# Patient Record
Sex: Male | Born: 1985 | Race: Black or African American | Hispanic: No | Marital: Single | Smoking: Current every day smoker
Health system: Southern US, Community
[De-identification: ages and names within clinical notes are randomized; demographics above are authoritative.]

## PROBLEM LIST (undated history)

## (undated) DIAGNOSIS — S86012A Strain of left Achilles tendon, initial encounter: Secondary | ICD-10-CM

## (undated) HISTORY — PX: NO PAST SURGERIES: SHX2092

---
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
## 2017-01-26 ENCOUNTER — Encounter (HOSPITAL_COMMUNITY): Payer: Self-pay | Admitting: Emergency Medicine

## 2017-01-26 ENCOUNTER — Emergency Department (HOSPITAL_COMMUNITY): Payer: Self-pay

## 2017-01-26 ENCOUNTER — Emergency Department (HOSPITAL_COMMUNITY)
Admission: EM | Admit: 2017-01-26 | Discharge: 2017-01-26 | Disposition: A | Discharge status: Home / Self Care | Payer: Self-pay | Attending: Emergency Medicine | Admitting: Emergency Medicine

## 2017-01-26 DIAGNOSIS — S86012A Strain of left Achilles tendon, initial encounter: Secondary | ICD-10-CM

## 2017-01-26 DIAGNOSIS — F17200 Nicotine dependence, unspecified, uncomplicated: Secondary | ICD-10-CM | POA: Insufficient documentation

## 2017-01-26 DIAGNOSIS — X501XXA Overexertion from prolonged static or awkward postures, initial encounter: Secondary | ICD-10-CM | POA: Insufficient documentation

## 2017-01-26 DIAGNOSIS — Y929 Unspecified place or not applicable: Secondary | ICD-10-CM | POA: Insufficient documentation

## 2017-01-26 DIAGNOSIS — Y999 Unspecified external cause status: Secondary | ICD-10-CM | POA: Insufficient documentation

## 2017-01-26 DIAGNOSIS — Y9367 Activity, basketball: Secondary | ICD-10-CM | POA: Insufficient documentation

## 2017-01-26 HISTORY — DX: Strain of left Achilles tendon, initial encounter: S86.012A

## 2017-01-26 MED ORDER — IBUPROFEN 800 MG PO TABS
800.0000 mg | ORAL_TABLET | Freq: Once | ORAL | Status: AC
  Administered 2017-01-26: 800 mg via ORAL
  Filled 2017-01-26: qty 1

## 2017-01-26 NOTE — ED Triage Notes (Signed)
Pt comes in with left heel/ankle pain after playing basketball yesterday and another player came down on it.  Able to walk with some pain and weakness.  Denies taking anything at home for the pain. A&O x4.  No other complaints at this time.

## 2017-01-26 NOTE — ED Notes (Signed)
Pt wheeled to vehicle.  Verbalized understanding of discharge instructions and follow up instructions.

## 2017-01-26 NOTE — ED Provider Notes (Signed)
[facility]-EMERGENCY DEPT Provider Note   CSN: [id number] Arrival date & time: 01/26/17  4098  By signing my name below, I, Gregory Watts, attest that this documentation has been prepared under the direction and in the presence of [employer], PA-C.  Electronically Signed: Rosario Watts, ED Scribe. 01/26/17. 10:37 AM.  History   Chief Complaint Chief Complaint  Patient presents with  . Foot Pain   The history is provided by the patient. No language interpreter was used.    HPI Comments: Gregory Watts is a 31 y.o. male who presents to the Emergency Department complaining of sudden onset, persistent posterior left heel/ankle pain beginning yesterday. He describes his pain as aching. Per pt, he was playing basketball yesterday when he jumped and collided with another player; while coming down to land the other player struck the posterior heel of the foot. He has been ambulatory since the incident with some difficulty and limping secondary to this exacerbating his pain. No treatments for his pain were tried prior to coming into the ED. He denies numbness, or any other associated symptoms.   History reviewed. No pertinent past medical history.  There are no active problems to display for this patient.  History reviewed. No pertinent surgical history.  Home Medications    Prior to Admission medications   Not on File   Family History No family history on file.  Social History Social History  Substance Use Topics  . Smoking status: Current Every Day Smoker    Packs/day: 0.25  . Smokeless tobacco: Never Used  . Alcohol use No   Allergies   Patient has no known allergies.  Review of Systems Review of Systems  Musculoskeletal: Positive for arthralgias and myalgias.  Neurological: Negative for numbness.   Physical Exam Updated Vital Signs BP 123/68 (BP Location: Right Arm)   Pulse 66   Temp 98.5 F (36.9 C) (Oral)   Resp 18   Ht 5\' 7"  (1.702 m)   Wt 79.4  kg (175 lb)   SpO2 99%   BMI 27.41 kg/m   Physical Exam  Constitutional: He appears well-developed and well-nourished. No distress.  HENT:  Head: Normocephalic and atraumatic.  Eyes: Conjunctivae are normal.  Neck: Normal range of motion.  Cardiovascular: Normal rate.   Pulmonary/Chest: Effort normal.  Abdominal: He exhibits no distension.  Musculoskeletal: He exhibits tenderness.  Obvious defect of the left proximal achilles. There is TTP. Positive Thompson's test on the left.   Neurological: He is alert.  Skin: No pallor.  Psychiatric: He has a normal mood and affect. His behavior is normal.  Nursing note and vitals reviewed.  ED Treatments / Results  DIAGNOSTIC STUDIES: Oxygen Saturation is 95% on RA, adequate by my interpretation.   COORDINATION OF CARE: 10:36 AM-Discussed next steps with pt. Pt verbalized understanding and is agreeable with the plan.   Radiology Dg Ankle Complete Left  Result Date: 01/26/2017 CLINICAL DATA:  Twisting injury of the left foot and ankle playing basketball. Lateral pain. EXAM: LEFT ANKLE COMPLETE - 3+ VIEW COMPARISON:  None. FINDINGS: There is no evidence of fracture, dislocation, or joint effusion. There is no evidence of arthropathy or other focal bone abnormality. Soft tissues are unremarkable. IMPRESSION: No acute osseous injury of the left ankle. Electronically Signed   By: Elige Ko   On: 01/26/2017 10:35   Dg Foot Complete Left  Result Date: 01/26/2017 CLINICAL DATA:  Pt injured lt foot and ankle yesterday while playing basketball when another player came  down on it, pain lt heel lateral lt ankle. EXAM: LEFT FOOT - COMPLETE 3+ VIEW COMPARISON:  None. FINDINGS: There is no evidence of fracture or dislocation. There is no evidence of arthropathy or other focal bone abnormality. Soft tissues are unremarkable. IMPRESSION: No acute osseous injury of the left foot. Electronically Signed   By: Elige KoHetal  Patel   On: 01/26/2017 11:05    Procedures Procedures   Medications Ordered in ED Medications  ibuprofen (ADVIL,MOTRIN) tablet 800 mg (800 mg Oral Given 01/26/17 1103)    Initial Impression / Assessment and Plan / ED Course  I have reviewed the triage vital signs and the nursing notes.  Pertinent labs & imaging results that were available during my care of the patient were reviewed by me and considered in my medical decision making (see chart for details).     Final Clinical Impressions(s) / ED Diagnoses   Final diagnoses:  Rupture of left Achilles tendon, initial encounter    Labs:   Imaging: DG ankle complete, DG foot complete ( ordered prior to my evaluation)   Consults:  Therapeutics:  Discharge Meds:   Assessment/Plan: 30 YOM with likely tendon rupture. Patient received screening foot and ankle plain films prior to my evaluation as part of triage protocol. Positive thompson test, minimal plantar flexion noted. Pt will be placed in splint, non weight bearing, RICE, close ortho follow up. Pt verbalized understanding and agreement to today's plan had no further questions or concerns at the time of discharge.    New Prescriptions There are no discharge medications for this patient.  I personally performed the services described in this documentation, which was scribed in my presence. The recorded information has been reviewed and is accurate.    Eyvonne MechanicHedges, Avram Danielson, PA-C 01/26/17 1223    Shaune PollackIsaacs, Cameron, MD 02/04/17 [phone]

## 2017-01-26 NOTE — Discharge Instructions (Signed)
Please read attached information. If you experience any new or worsening signs or symptoms please return to the emergency room for evaluation. Please do not place weight on injury leg. Keep this elevated and apply ice as discussed.Please follow-up with specialist as discussed. Please use ibuprofen as needed for discomfort.

## 2017-02-07 ENCOUNTER — Encounter (INDEPENDENT_AMBULATORY_CARE_PROVIDER_SITE_OTHER): Payer: Self-pay | Admitting: Orthopaedic Surgery

## 2017-02-07 ENCOUNTER — Other Ambulatory Visit (INDEPENDENT_AMBULATORY_CARE_PROVIDER_SITE_OTHER): Payer: Self-pay | Admitting: Orthopaedic Surgery

## 2017-02-07 ENCOUNTER — Ambulatory Visit (INDEPENDENT_AMBULATORY_CARE_PROVIDER_SITE_OTHER): Payer: Self-pay | Admitting: Orthopaedic Surgery

## 2017-02-07 DIAGNOSIS — S86012A Strain of left Achilles tendon, initial encounter: Secondary | ICD-10-CM

## 2017-02-07 NOTE — Progress Notes (Signed)
   Office Visit Note   Patient: Gregory HerrlichJarrod Watts           Date of Birth: 11/15/1985           MRN: [id number] Visit Date: 02/07/2017              Requested by: No referring provider defined for this encounter. PCP: Patient, No Pcp Per   Assessment & Plan: Visit Diagnoses:  1. Rupture of left Achilles tendon, initial encounter     Plan: Impression is left acute Achilles rupture. We discussed surgical versus nonsurgical treatment options and the risks benefits alternatives to surgery. He understands and wishes to proceed. He denies any history of DVT and he smokes 3 cigarettes a day.  Follow-Up Instructions: Return in about 2 weeks (around 02/21/2017).   Orders:  No orders of the defined types were placed in this encounter.  No orders of the defined types were placed in this encounter.     Procedures: No procedures performed   Clinical Data: No additional findings.   Subjective: Chief Complaint  Patient presents with  . Left Foot - Injury    Patient is a 31 year old gentleman who sustained a left Achilles rupture about 2 weeks ago and was evaluated in the ER. He was referred here. He states that his pain is gotten a lot better. She was originally planned as well when he felt the tear. The pain does not radiate. He's been nonweightbearing in a splint and crutches.    Review of Systems  Constitutional: Negative.   All other systems reviewed and are negative.    Objective: Vital Signs: There were no vitals taken for this visit.  Physical Exam  Constitutional: He is oriented to person, place, and time. He appears well-developed and well-nourished.  HENT:  Head: Normocephalic and atraumatic.  Eyes: Pupils are equal, round, and reactive to light.  Neck: Neck supple.  Pulmonary/Chest: Effort normal.  Abdominal: Soft.  Musculoskeletal: Normal range of motion.  Neurological: He is alert and oriented to person, place, and time.  Skin: Skin is warm.  Psychiatric:  He has a normal mood and affect. His behavior is normal. Judgment and thought content normal.  Nursing note and vitals reviewed.   Ortho Exam Left ankle exam shows a palpable defect of the Achilles tendon. He has a abnormal Thompson's test. Specialty Comments:  No specialty comments available.  Imaging: No results found.   PMFS History: Patient Active Problem List   Diagnosis Date Noted  . Achilles rupture, left 02/07/2017   No past medical history on file.  No family history on file.  No past surgical history on file. Social History   Occupational History  . Not on file.   Social History Main Topics  . Smoking status: Current Every Day Smoker    Packs/day: 0.25  . Smokeless tobacco: Never Used  . Alcohol use No  . Drug use: No  . Sexual activity: Not on file

## 2017-02-08 ENCOUNTER — Encounter (HOSPITAL_BASED_OUTPATIENT_CLINIC_OR_DEPARTMENT_OTHER): Payer: Self-pay | Admitting: *Deleted

## 2017-02-09 ENCOUNTER — Ambulatory Visit (HOSPITAL_BASED_OUTPATIENT_CLINIC_OR_DEPARTMENT_OTHER): Payer: Self-pay | Admitting: Anesthesiology

## 2017-02-09 ENCOUNTER — Encounter (HOSPITAL_BASED_OUTPATIENT_CLINIC_OR_DEPARTMENT_OTHER): Payer: Self-pay | Admitting: Anesthesiology

## 2017-02-09 ENCOUNTER — Ambulatory Visit (HOSPITAL_BASED_OUTPATIENT_CLINIC_OR_DEPARTMENT_OTHER)
Admission: RE | Admit: 2017-02-09 | Discharge: 2017-02-09 | Disposition: A | Discharge status: Home / Self Care | Payer: Self-pay | Source: Ambulatory Visit | Attending: Orthopaedic Surgery | Admitting: Orthopaedic Surgery

## 2017-02-09 ENCOUNTER — Encounter (HOSPITAL_BASED_OUTPATIENT_CLINIC_OR_DEPARTMENT_OTHER)
Admission: RE | Disposition: A | Discharge status: Home / Self Care | Payer: Self-pay | Source: Ambulatory Visit | Attending: Orthopaedic Surgery

## 2017-02-09 DIAGNOSIS — S86012D Strain of left Achilles tendon, subsequent encounter: Secondary | ICD-10-CM

## 2017-02-09 DIAGNOSIS — F17210 Nicotine dependence, cigarettes, uncomplicated: Secondary | ICD-10-CM | POA: Insufficient documentation

## 2017-02-09 DIAGNOSIS — X58XXXA Exposure to other specified factors, initial encounter: Secondary | ICD-10-CM | POA: Insufficient documentation

## 2017-02-09 DIAGNOSIS — S86012A Strain of left Achilles tendon, initial encounter: Secondary | ICD-10-CM | POA: Insufficient documentation

## 2017-02-09 HISTORY — DX: Strain of left Achilles tendon, initial encounter: S86.012A

## 2017-02-09 HISTORY — PX: ACHILLES TENDON SURGERY: SHX542

## 2017-02-09 SURGERY — REPAIR, TENDON, ACHILLES
Anesthesia: Regional | Site: Ankle | Laterality: Left

## 2017-02-09 MED ORDER — ONDANSETRON HCL 4 MG PO TABS: 4.0000 mg | ORAL_TABLET | Freq: Three times a day (TID) | ORAL | 0 refills | Status: AC | PRN

## 2017-02-09 MED ORDER — CEFAZOLIN SODIUM 1 G IJ SOLR
INTRAMUSCULAR | Status: AC
  Filled 2017-02-09: qty 20

## 2017-02-09 MED ORDER — METHOCARBAMOL 750 MG PO TABS: 750.0000 mg | ORAL_TABLET | Freq: Two times a day (BID) | ORAL | 0 refills | Status: AC | PRN

## 2017-02-09 MED ORDER — SUCCINYLCHOLINE CHLORIDE 20 MG/ML IJ SOLN
INTRAMUSCULAR | Status: DC | PRN
  Administered 2017-02-09: 50 mg via INTRAVENOUS

## 2017-02-09 MED ORDER — KETOROLAC TROMETHAMINE 30 MG/ML IJ SOLN
INTRAMUSCULAR | Status: AC
  Filled 2017-02-09: qty 1

## 2017-02-09 MED ORDER — FENTANYL CITRATE (PF) 100 MCG/2ML IJ SOLN
25.0000 ug | INTRAMUSCULAR | Status: DC | PRN
  Administered 2017-02-09 (×3): 50 ug via INTRAVENOUS

## 2017-02-09 MED ORDER — MIDAZOLAM HCL 5 MG/5ML IJ SOLN
INTRAMUSCULAR | Status: DC | PRN
  Administered 2017-02-09: 2 mg via INTRAVENOUS

## 2017-02-09 MED ORDER — FENTANYL CITRATE (PF) 100 MCG/2ML IJ SOLN
50.0000 ug | INTRAMUSCULAR | Status: DC | PRN
  Administered 2017-02-09: 100 ug via INTRAVENOUS

## 2017-02-09 MED ORDER — FENTANYL CITRATE (PF) 100 MCG/2ML IJ SOLN
INTRAMUSCULAR | Status: AC
  Filled 2017-02-09: qty 2

## 2017-02-09 MED ORDER — MEPERIDINE HCL 25 MG/ML IJ SOLN: 6.2500 mg | INTRAMUSCULAR | Status: DC | PRN

## 2017-02-09 MED ORDER — SCOPOLAMINE 1 MG/3DAYS TD PT72: 1.0000 | MEDICATED_PATCH | Freq: Once | TRANSDERMAL | Status: DC | PRN

## 2017-02-09 MED ORDER — MIDAZOLAM HCL 2 MG/2ML IJ SOLN
INTRAMUSCULAR | Status: AC
  Filled 2017-02-09: qty 2

## 2017-02-09 MED ORDER — SENNOSIDES-DOCUSATE SODIUM 8.6-50 MG PO TABS: 1.0000 | ORAL_TABLET | Freq: Every evening | ORAL | 1 refills | Status: AC | PRN

## 2017-02-09 MED ORDER — OXYCODONE HCL 5 MG PO TABS
ORAL_TABLET | ORAL | Status: AC
  Filled 2017-02-09: qty 1

## 2017-02-09 MED ORDER — OXYCODONE-ACETAMINOPHEN 5-325 MG PO TABS: 1.0000 | ORAL_TABLET | ORAL | 0 refills | Status: DC | PRN

## 2017-02-09 MED ORDER — ONDANSETRON HCL 4 MG/2ML IJ SOLN
INTRAMUSCULAR | Status: DC | PRN
  Administered 2017-02-09: 4 mg via INTRAVENOUS

## 2017-02-09 MED ORDER — MIDAZOLAM HCL 2 MG/2ML IJ SOLN
1.0000 mg | INTRAMUSCULAR | Status: DC | PRN
  Administered 2017-02-09: 2 mg via INTRAVENOUS

## 2017-02-09 MED ORDER — DEXAMETHASONE SODIUM PHOSPHATE 10 MG/ML IJ SOLN
INTRAMUSCULAR | Status: AC
  Filled 2017-02-09: qty 1

## 2017-02-09 MED ORDER — BUPIVACAINE-EPINEPHRINE (PF) 0.5% -1:200000 IJ SOLN
INTRAMUSCULAR | Status: DC | PRN
  Administered 2017-02-09: 30 mL via PERINEURAL

## 2017-02-09 MED ORDER — ONDANSETRON HCL 4 MG/2ML IJ SOLN
INTRAMUSCULAR | Status: AC
  Filled 2017-02-09: qty 2

## 2017-02-09 MED ORDER — SODIUM CHLORIDE 0.9 % IJ SOLN
INTRAMUSCULAR | Status: AC
  Filled 2017-02-09: qty 10

## 2017-02-09 MED ORDER — CEFAZOLIN SODIUM-DEXTROSE 2-4 GM/100ML-% IV SOLN
2.0000 g | INTRAVENOUS | Status: AC
  Administered 2017-02-09: 2 g via INTRAVENOUS

## 2017-02-09 MED ORDER — PROPOFOL 10 MG/ML IV BOLUS
INTRAVENOUS | Status: DC | PRN
  Administered 2017-02-09: 200 mg via INTRAVENOUS

## 2017-02-09 MED ORDER — SUCCINYLCHOLINE CHLORIDE 200 MG/10ML IV SOSY
PREFILLED_SYRINGE | INTRAVENOUS | Status: AC
  Filled 2017-02-09: qty 10

## 2017-02-09 MED ORDER — ASPIRIN EC 325 MG PO TBEC: 325.0000 mg | DELAYED_RELEASE_TABLET | Freq: Two times a day (BID) | ORAL | 0 refills | Status: DC

## 2017-02-09 MED ORDER — LACTATED RINGERS IV SOLN
INTRAVENOUS | Status: DC
  Administered 2017-02-09 (×3): via INTRAVENOUS

## 2017-02-09 MED ORDER — FENTANYL CITRATE (PF) 100 MCG/2ML IJ SOLN
INTRAMUSCULAR | Status: DC | PRN
Start: 2017-02-09 — End: 2017-02-09
  Administered 2017-02-09: 100 ug via INTRAVENOUS

## 2017-02-09 MED ORDER — LIDOCAINE 2% (20 MG/ML) 5 ML SYRINGE
INTRAMUSCULAR | Status: AC
  Filled 2017-02-09: qty 5

## 2017-02-09 MED ORDER — LIDOCAINE HCL (CARDIAC) 20 MG/ML IV SOLN
INTRAVENOUS | Status: DC | PRN
  Administered 2017-02-09: 10 mg via INTRAVENOUS

## 2017-02-09 MED ORDER — PROMETHAZINE HCL 25 MG/ML IJ SOLN
6.2500 mg | INTRAMUSCULAR | Status: DC | PRN
Start: 2017-02-09 — End: 2017-02-09

## 2017-02-09 MED ORDER — PROPOFOL 10 MG/ML IV BOLUS
INTRAVENOUS | Status: AC
  Filled 2017-02-09: qty 20

## 2017-02-09 MED ORDER — KETOROLAC TROMETHAMINE 30 MG/ML IJ SOLN
30.0000 mg | Freq: Once | INTRAMUSCULAR | Status: DC | PRN
  Administered 2017-02-09: 30 mg via INTRAVENOUS

## 2017-02-09 MED ORDER — OXYCODONE HCL 5 MG/5ML PO SOLN: 5.0000 mg | Freq: Once | ORAL | Status: AC | PRN

## 2017-02-09 MED ORDER — OXYCODONE HCL 5 MG PO TABS
5.0000 mg | ORAL_TABLET | Freq: Once | ORAL | Status: AC | PRN
  Administered 2017-02-09: 5 mg via ORAL

## 2017-02-09 MED ORDER — DEXAMETHASONE SODIUM PHOSPHATE 4 MG/ML IJ SOLN
INTRAMUSCULAR | Status: DC | PRN
  Administered 2017-02-09: 10 mg via INTRAVENOUS

## 2017-02-09 SURGICAL SUPPLY — 68 items
BANDAGE ACE 4X5 VEL STRL LF (GAUZE/BANDAGES/DRESSINGS) IMPLANT
BANDAGE ACE 6X5 VEL STRL LF (GAUZE/BANDAGES/DRESSINGS) ×3 IMPLANT
BANDAGE ESMARK 6X9 LF (GAUZE/BANDAGES/DRESSINGS) ×1 IMPLANT
BLADE HEX COATED 2.75 (ELECTRODE) IMPLANT
BLADE SURG 15 STRL LF DISP TIS (BLADE) ×2 IMPLANT
BLADE SURG 15 STRL SS (BLADE) ×6
BNDG CMPR 9X6 STRL LF SNTH (GAUZE/BANDAGES/DRESSINGS) ×1
BNDG COHESIVE 3X5 TAN STRL LF (GAUZE/BANDAGES/DRESSINGS) ×3 IMPLANT
BNDG ESMARK 6X9 LF (GAUZE/BANDAGES/DRESSINGS) ×3
CANISTER SUCT 1200ML W/VALVE (MISCELLANEOUS) ×3 IMPLANT
COVER BACK TABLE 60X90IN (DRAPES) ×3 IMPLANT
CUFF TOURNIQUET SINGLE 34IN LL (TOURNIQUET CUFF) ×2 IMPLANT
DECANTER SPIKE VIAL GLASS SM (MISCELLANEOUS) IMPLANT
DRAPE EXTREMITY T 121X128X90 (DRAPE) ×3 IMPLANT
DRAPE SURG 17X23 STRL (DRAPES) ×6 IMPLANT
DRAPE U 60X70 (DRAPES) ×3 IMPLANT
DRSG PAD ABDOMINAL 8X10 ST (GAUZE/BANDAGES/DRESSINGS) ×3 IMPLANT
DURAPREP 26ML APPLICATOR (WOUND CARE) ×3 IMPLANT
ELECT REM PT RETURN 9FT ADLT (ELECTROSURGICAL) ×3
ELECTRODE REM PT RTRN 9FT ADLT (ELECTROSURGICAL) ×1 IMPLANT
GAUZE SPONGE 4X4 12PLY STRL (GAUZE/BANDAGES/DRESSINGS) ×3 IMPLANT
GAUZE SPONGE 4X4 16PLY XRAY LF (GAUZE/BANDAGES/DRESSINGS) IMPLANT
GAUZE XEROFORM 1X8 LF (GAUZE/BANDAGES/DRESSINGS) ×3 IMPLANT
GLOVE BIO SURGEON STRL SZ 6.5 (GLOVE) ×1 IMPLANT
GLOVE BIO SURGEONS STRL SZ 6.5 (GLOVE) ×1
GLOVE BIOGEL PI IND STRL 7.0 (GLOVE) IMPLANT
GLOVE BIOGEL PI INDICATOR 7.0 (GLOVE) ×4
GLOVE SKINSENSE NS SZ7.5 (GLOVE) ×2
GLOVE SKINSENSE STRL SZ7.5 (GLOVE) ×1 IMPLANT
GLOVE SURG SYN 7.5  E (GLOVE) ×2
GLOVE SURG SYN 7.5 E (GLOVE) ×1 IMPLANT
GLOVE SURG SYN 7.5 PF PI (GLOVE) ×1 IMPLANT
GOWN STRL REIN XL XLG (GOWN DISPOSABLE) ×3 IMPLANT
GOWN STRL REUS W/ TWL LRG LVL3 (GOWN DISPOSABLE) ×1 IMPLANT
GOWN STRL REUS W/TWL LRG LVL3 (GOWN DISPOSABLE) ×3
KIT IMPLANT SUTURE PARS (Kit) ×2 IMPLANT
MANIFOLD NEPTUNE II (INSTRUMENTS) ×2 IMPLANT
NEEDLE HYPO 22GX1.5 SAFETY (NEEDLE) IMPLANT
NS IRRIG 1000ML POUR BTL (IV SOLUTION) ×3 IMPLANT
PACK BASIN DAY SURGERY FS (CUSTOM PROCEDURE TRAY) ×3 IMPLANT
PAD CAST 3X4 CTTN HI CHSV (CAST SUPPLIES) IMPLANT
PAD CAST 4YDX4 CTTN HI CHSV (CAST SUPPLIES) ×1 IMPLANT
PADDING CAST COTTON 3X4 STRL (CAST SUPPLIES)
PADDING CAST COTTON 4X4 STRL (CAST SUPPLIES) ×3
PADDING CAST COTTON 6X4 STRL (CAST SUPPLIES) ×3 IMPLANT
PADDING CAST SYN 6 (CAST SUPPLIES) ×2
PADDING CAST SYNTHETIC 4 (CAST SUPPLIES) ×2
PADDING CAST SYNTHETIC 4X4 STR (CAST SUPPLIES) IMPLANT
PADDING CAST SYNTHETIC 6X4 NS (CAST SUPPLIES) IMPLANT
PENCIL BUTTON HOLSTER BLD 10FT (ELECTRODE) IMPLANT
SLEEVE SCD COMPRESS KNEE MED (MISCELLANEOUS) ×3 IMPLANT
SPLINT FIBERGLASS 4X30 (CAST SUPPLIES) ×3 IMPLANT
SPONGE LAP 18X18 X RAY DECT (DISPOSABLE) ×3 IMPLANT
SUCTION FRAZIER HANDLE 10FR (MISCELLANEOUS)
SUCTION TUBE FRAZIER 10FR DISP (MISCELLANEOUS) IMPLANT
SUT ETHILON 2 0 FS 18 (SUTURE) ×3 IMPLANT
SUT ETHILON 3 0 PS 1 (SUTURE) IMPLANT
SUT FIBERWIRE #2 38 T-5 BLUE (SUTURE)
SUT VIC AB 2-0 CT1 27 (SUTURE) ×3
SUT VIC AB 2-0 CT1 TAPERPNT 27 (SUTURE) ×1 IMPLANT
SUTURE FIBERWR #2 38 T-5 BLUE (SUTURE) IMPLANT
SYR BULB 3OZ (MISCELLANEOUS) ×3 IMPLANT
SYR CONTROL 10ML LL (SYRINGE) IMPLANT
TOWEL OR 17X24 6PK STRL BLUE (TOWEL DISPOSABLE) ×5 IMPLANT
TUBE CONNECTING 20'X1/4 (TUBING) ×1
TUBE CONNECTING 20X1/4 (TUBING) ×2 IMPLANT
UNDERPAD 30X30 (UNDERPADS AND DIAPERS) ×3 IMPLANT
YANKAUER SUCT BULB TIP NO VENT (SUCTIONS) ×3 IMPLANT

## 2017-02-09 NOTE — Op Note (Signed)
   Date of Surgery: 02/09/2017  INDICATIONS: Mr. Gregory Watts is a 31 y.o.-year-old male who sustained an acute left achilles rupture. The risks and benefits of the procedure discussed with the patient prior to the procedure and all questions were answered; consent was obtained.  PREOPERATIVE DIAGNOSIS: left achilles rupture, acute  POSTOPERATIVE DIAGNOSIS: Same   PROCEDURE: Treatment of left achilles rupture , without graft  SURGEON: N. Glee ArvinMichael Ronasia Isola, M.D.   ANESTHESIA: general   IV FLUIDS AND URINE: See anesthesia record   ESTIMATED BLOOD LOSS: minimal  IMPLANTS: [employer] PARS system  COMPLICATIONS: None.   DESCRIPTION OF PROCEDURE: The patient was brought to the operating room and placed prone on the operating table. The patient's leg had been signed prior to the procedure. The patient had the anesthesia placed by the anesthesiologist. The prep verification and incision time-outs were performed to confirm that this was the correct patient, site, side and location. The patient had an SCD on the opposite lower extremity. A 2 cm longitudinal incision based over the rupture was used.  Blunt dissection was taken down to the level of the paratenon.  The paratenon was sharply incised in line with the incision.  The rupture was exposed.  A space was created between the paratenon and the achilles tendon on both sides going up and down the tendon.  The sural nerve was visualized and protected during the procedure.  An alise clamp was used to pull tension on the tendon end.  The sled was advanced up the tendon proximally making sure the tendon was straddled between the arms.  The sutures were passed sequentially and then brought out through the incision.  The same steps were then repeated for the distal portion of the tendon.  The foot was then placed in maximum plantarflexion and the sutures were tied down.  The wound was then irrigated thoroughly.  The paratenon was was closed using 0 vicryl.  The  subcutaneous layer was closed with 2-0 vicryl and the skin with interrupted 2-0 nylon.  A sterile dressing was applied.  A short leg splint was placed with the ankle in maximum plantarflexion.  The patient awoke from anesthesia uneventfully and transported to the PACU.  POSTOPERATIVE PLAN: The patient will be non weight bearing for two weeks and return for suture removal and transition to a fracture boot.  The patient will be placed on DVT chemoprophylaxis.  Mayra ReelN. Michael Aladdin Kollmann, MD [facility] [phone] 11:14 AM

## 2017-02-09 NOTE — Transfer of Care (Signed)
Immediate Anesthesia Transfer of Care Note  Patient: Vedant Osmanovic  Procedure(s) Performed: Procedure(s): LEFT ACHILLES TENDON REPAIR (Left)  Patient Location: PACU  Anesthesia Type:GA combined with regional for post-op pain  Level of Consciousness: awake  Airway & Oxygen Therapy: Patient Spontanous Breathing and Patient connected to face mask oxygen  Post-op Assessment: Report given to RN and Post -op Vital signs reviewed and stable  Post vital signs: Reviewed and stable  Last Vitals:  Vitals:   02/09/17 0945 02/09/17 0950  BP: 124/88   Pulse: (!) 53 (!) 52  Resp: 18 16  Temp:      Last Pain:  Vitals:   02/09/17 0835  TempSrc: Oral      Patients Stated Pain Goal: 0 (02/09/17 0835)  Complications: No apparent anesthesia complications

## 2017-02-09 NOTE — Progress Notes (Signed)
Assisted Dr. Germeroth with left, ultrasound guided, popliteal block. Side rails up, monitors on throughout procedure. See vital signs in flow sheet. Tolerated Procedure well. 

## 2017-02-09 NOTE — Anesthesia Procedure Notes (Signed)
Anesthesia Regional Block: Popliteal block   Pre-Anesthetic Checklist: ,, timeout performed, Correct Patient, Correct Site, Correct Laterality, Correct Procedure, Correct Position, site marked, Risks and benefits discussed,  Surgical consent,  Pre-op evaluation,  At surgeon's request and post-op pain management  Laterality: Left  Prep: chloraprep       Needles:  Injection technique: Single-shot  Needle Type: Stimiplex     Needle Length: 10cm  Needle Gauge: 21     Additional Needles:   Procedures: ultrasound guided,,,,,,,,  Motor weakness within 5 minutes.   Nerve Stimulator or Paresthesia:  Response: 0.5 mA,   Additional Responses:   Narrative:  Start time: 02/09/2017 9:18 AM End time: 02/09/2017 9:22 AM Injection made incrementally with aspirations every 5 mL.  Performed by: Personally  Anesthesiologist: Lewie LoronGERMEROTH, Glinda Natzke  Additional Notes: Nerve located and needle positioned with direct ultrasound guidance. Good perineural spread. Patient tolerated well.

## 2017-02-09 NOTE — Discharge Instructions (Signed)
    1. Keep splint clean and dry 2. Elevate foot above level of the heart 3. Take aspirin to prevent blood clots 4. Take pain meds as needed 5. Strict non weight bearing to operative extremity   Post Anesthesia Home Care Instructions  Activity: Get plenty of rest for the remainder of the day. A responsible individual must stay with you for 24 hours following the procedure.  For the next 24 hours, DO NOT: -Drive a car -Operate machinery -Drink alcoholic beverages -Take any medication unless instructed by your physician -Make any legal decisions or sign important papers.  Meals: Start with liquid foods such as gelatin or soup. Progress to regular foods as tolerated. Avoid greasy, spicy, heavy foods. If nausea and/or vomiting occur, drink only clear liquids until the nausea and/or vomiting subsides. Call your physician if vomiting continues.  Special Instructions/Symptoms: Your throat may feel dry or sore from the anesthesia or the breathing tube placed in your throat during surgery. If this causes discomfort, gargle with warm salt water. The discomfort should disappear within 24 hours.  If you had a scopolamine patch placed behind your ear for the management of post- operative nausea and/or vomiting:  1. The medication in the patch is effective for 72 hours, after which it should be removed.  Wrap patch in a tissue and discard in the trash. Wash hands thoroughly with soap and water. 2. You may remove the patch earlier than 72 hours if you experience unpleasant side effects which may include dry mouth, dizziness or visual disturbances. 3. Avoid touching the patch. Wash your hands with soap and water after contact with the patch.  Regional Anesthesia Blocks  1. Numbness or the inability to move the "blocked" extremity may last from 3-48 hours after placement. The length of time depends on the medication injected and your individual response to the medication. If the numbness is not  going away after 48 hours, call your surgeon.  2. The extremity that is blocked will need to be protected until the numbness is gone and the  Strength has returned. Because you cannot feel it, you will need to take extra care to avoid injury. Because it may be weak, you may have difficulty moving it or using it. You may not know what position it is in without looking at it while the block is in effect.  3. For blocks in the legs and feet, returning to weight bearing and walking needs to be done carefully. You will need to wait until the numbness is entirely gone and the strength has returned. You should be able to move your leg and foot normally before you try and bear weight or walk. You will need someone to be with you when you first try to ensure you do not fall and possibly risk injury.  4. Bruising and tenderness at the needle site are common side effects and will resolve in a few days.  5. Persistent numbness or new problems with movement should be communicated to the surgeon or the [facility] ([phone])/ [facility] ([phone]).      

## 2017-02-09 NOTE — H&P (Signed)
    PREOPERATIVE H&P  Chief Complaint: left achilles rupture  HPI: Gregory Watts is a 31 y.o. male who presents for surgical treatment of left achilles rupture.  He denies any changes in medical history.  Past Medical History:  Diagnosis Date  . Achilles rupture, left 01/26/2017   Past Surgical History:  Procedure Laterality Date  . NO PAST SURGERIES     Social History   Social History  . Marital status: Single    Spouse name: N/A  . Number of children: N/A  . Years of education: N/A   Social History Main Topics  . Smoking status: Current Every Day Smoker    Packs/day: 0.00    Years: 10.00  . Smokeless tobacco: Never Used     Comment: 3 cig./day  . Alcohol use Yes     Comment: 4 drinks 3 x/week  . Drug use: No  . Sexual activity: Not Asked   Other Topics Concern  . None   Social History Narrative  . None   History reviewed. No pertinent family history. No Known Allergies Prior to Admission medications   Not on File     Positive ROS: All other systems have been reviewed and were otherwise negative with the exception of those mentioned in the HPI and as above.  Physical Exam: General: Alert, no acute distress Cardiovascular: No pedal edema Respiratory: No cyanosis, no use of accessory musculature GI: abdomen soft Skin: No lesions in the area of chief complaint Neurologic: Sensation intact distally Psychiatric: Patient is competent for consent with normal mood and affect Lymphatic: no lymphedema  MUSCULOSKELETAL: exam stable  Assessment: left achilles rupture  Plan: Plan for Procedure(s): LEFT ACHILLES TENDON REPAIR  The risks benefits and alternatives were discussed with the patient including but not limited to the risks of nonoperative treatment, versus surgical intervention including infection, bleeding, nerve injury,  blood clots, cardiopulmonary complications, morbidity, mortality, among others, and they were willing to proceed.   Gregory Watts  Gregory Brosh, MD   02/09/2017 8:28 AM

## 2017-02-09 NOTE — Anesthesia Postprocedure Evaluation (Signed)
Anesthesia Post Note  Patient: Gregory Watts  Procedure(s) Performed: Procedure(s) (LRB): LEFT ACHILLES TENDON REPAIR (Left)     Patient location during evaluation: PACU Anesthesia Type: Regional and General Level of consciousness: sedated and patient cooperative Pain management: pain level controlled Vital Signs Assessment: post-procedure vital signs reviewed and stable Respiratory status: spontaneous breathing Cardiovascular status: stable Anesthetic complications: no    Last Vitals:  Vitals:   02/09/17 1132 02/09/17 1145  BP: 140/88 (!) 133/96  Pulse: 78 75  Resp: 20 (!) 24  Temp: 36.3 C     Last Pain:  Vitals:   02/09/17 1200  TempSrc:   PainSc: 0-No pain    LLE Motor Response: No movement due to regional block (02/09/17 1200) LLE Sensation: No sensation (absent) (02/09/17 1200)          Lewie LoronJohn Jun Rightmyer

## 2017-02-09 NOTE — Anesthesia Procedure Notes (Signed)
Procedure Name: Intubation Date/Time: 02/09/2017 10:03 AM Performed by: Tedrow DesanctisLINKA, Belkys Henault L Pre-anesthesia Checklist: Patient identified, Emergency Drugs available, Suction available, Patient being monitored and Timeout performed Patient Re-evaluated:Patient Re-evaluated prior to inductionOxygen Delivery Method: Circle system utilized Preoxygenation: Pre-oxygenation with 100% oxygen Intubation Type: IV induction Ventilation: Mask ventilation without difficulty Laryngoscope Size: Miller and 3 Grade View: Grade II Tube type: Oral Number of attempts: 1 Airway Equipment and Method: Stylet and Oral airway Placement Confirmation: ETT inserted through vocal cords under direct vision,  positive ETCO2 and breath sounds checked- equal and bilateral Secured at: 23 cm Tube secured with: Tape Dental Injury: Teeth and Oropharynx as per pre-operative assessment

## 2017-02-09 NOTE — Anesthesia Preprocedure Evaluation (Addendum)
Anesthesia Evaluation  Patient identified by MRN, date of birth, ID band Patient awake    Reviewed: Allergy & Precautions, NPO status , Patient's Chart, lab work & pertinent test results  Airway Mallampati: II  TM Distance: >3 FB Neck ROM: Full    Dental no notable dental hx.    Pulmonary Current Smoker,    Pulmonary exam normal breath sounds clear to auscultation       Cardiovascular negative cardio ROS Normal cardiovascular exam Rhythm:Regular Rate:Normal     Neuro/Psych negative neurological ROS  negative psych ROS   GI/Hepatic negative GI ROS, Neg liver ROS,   Endo/Other  negative endocrine ROS  Renal/GU negative Renal ROS     Musculoskeletal negative musculoskeletal ROS (+)   Abdominal   Peds  Hematology negative hematology ROS (+)   Anesthesia Other Findings   Reproductive/Obstetrics                             Anesthesia Physical Anesthesia Plan  ASA: II  Anesthesia Plan: General and Regional   Post-op Pain Management: GA combined w/ Regional for post-op pain   Induction: Intravenous  PONV Risk Score and Plan: 1 and Ondansetron and Propofol  Airway Management Planned: Oral ETT  Additional Equipment:   Intra-op Plan:   Post-operative Plan: Extubation in OR  Informed Consent: I have reviewed the patients History and Physical, chart, labs and discussed the procedure including the risks, benefits and alternatives for the proposed anesthesia with the patient or authorized representative who has indicated his/her understanding and acceptance.   Dental advisory given  Plan Discussed with: CRNA  Anesthesia Plan Comments:        Anesthesia Quick Evaluation

## 2017-02-10 ENCOUNTER — Encounter (HOSPITAL_BASED_OUTPATIENT_CLINIC_OR_DEPARTMENT_OTHER): Payer: Self-pay | Admitting: Orthopaedic Surgery

## 2017-02-21 ENCOUNTER — Ambulatory Visit (INDEPENDENT_AMBULATORY_CARE_PROVIDER_SITE_OTHER): Payer: Self-pay | Admitting: Orthopaedic Surgery

## 2017-02-21 ENCOUNTER — Encounter (INDEPENDENT_AMBULATORY_CARE_PROVIDER_SITE_OTHER): Payer: Self-pay | Admitting: Orthopaedic Surgery

## 2017-02-21 DIAGNOSIS — S86012A Strain of left Achilles tendon, initial encounter: Secondary | ICD-10-CM

## 2017-02-21 MED ORDER — OXYCODONE-ACETAMINOPHEN 5-325 MG PO TABS: 1.0000 | ORAL_TABLET | Freq: Every day | ORAL | 0 refills | Status: AC | PRN

## 2017-02-21 NOTE — Progress Notes (Signed)
Patient is 2 weeks status post left Achilles repair. She is doing well. His sutures are intact and the incision is healed. There is no signs of infection or wound problems. He is neurovascularly intact. The sutures were removed today. Begin protected weightbearing with crutches and the Cam Walker into heel lifts. Physical therapy and home exercises per protocol. Follow-up with me in 4 weeks. May discontinue aspirin at this point.

## 2017-02-24 ENCOUNTER — Ambulatory Visit: Payer: Self-pay | Attending: Orthopaedic Surgery

## 2017-02-24 DIAGNOSIS — Z9889 Other specified postprocedural states: Secondary | ICD-10-CM

## 2017-02-24 DIAGNOSIS — R262 Difficulty in walking, not elsewhere classified: Secondary | ICD-10-CM

## 2017-02-24 DIAGNOSIS — R600 Localized edema: Secondary | ICD-10-CM

## 2017-02-24 DIAGNOSIS — M6281 Muscle weakness (generalized): Secondary | ICD-10-CM

## 2017-02-24 DIAGNOSIS — M25672 Stiffness of left ankle, not elsewhere classified: Secondary | ICD-10-CM

## 2017-02-24 DIAGNOSIS — M25572 Pain in left ankle and joints of left foot: Secondary | ICD-10-CM

## 2017-02-24 NOTE — Patient Instructions (Signed)
Issued active LT ankle ROM with cue for no PROM and limit per pain level and SLR 4 way 10-20 reps 2-3 x/day

## 2017-02-24 NOTE — Therapy (Signed)
[facility] [address] [place], [place], [address] Phone: [phone]   Fax:  [phone]  Physical Therapy Evaluation  Patient Details  Name: Gregory HerrlichJarrod Watts MRN: [id number] Date of Birth: 10/01/1985 Referring Provider: Donnelly StagerM Xu, MD  Encounter Date: 02/24/2017      PT End of Session - 02/24/17 1442    Visit Number 1   Number of Visits 29   Date for PT Re-Evaluation 06/17/17   Authorization Type Self pay   PT Start Time 0245   PT Stop Time 0335   PT Time Calculation (min) 50 min   Activity Tolerance Patient tolerated treatment well   Behavior During Therapy [facility] for tasks assessed/performed      Past Medical History:  Diagnosis Date  . Achilles rupture, left 01/26/2017    Past Surgical History:  Procedure Laterality Date  . ACHILLES TENDON SURGERY Left 02/09/2017   Procedure: LEFT ACHILLES TENDON REPAIR;  Surgeon: Tarry KosXu, Naiping M, MD;  Location: [facility];  Service: Orthopedics;  Laterality: Left;  . NO PAST SURGERIES      There were no vitals filed for this visit.       Subjective Assessment - 02/24/17 1453    Subjective He reports playing basketball and came down with rebound and someone stepped on back of heel and was injured   Limitations Walking;Standing;House hold activities  Not working   How long can you sit comfortably? PRN   How long can you stand comfortably? As needed   How long can you walk comfortably? As needed   Patient Stated Goals Get heel back right.  He wants to be ale to walk  without device,  jog /run,  return to work and leisure activity   Currently in Pain? No/denies  some pain at incision   Pain Score 1    Pain Location --  incision   Pain Orientation Left   Pain Descriptors / Indicators Aching   Pain Type Surgical pain   Pain Onset 1 to 4 weeks ago   Pain Frequency Intermittent   Aggravating Factors  Wearing boot    Pain Relieving Factors Removing boot. Meds x 2 per  day.   (PAin level before meds 5/10)     Multiple Pain Sites No            [facility] PT Assessment - 02/24/17 0001      Assessment   Medical Diagnosis LT achilles tendon repair   Referring Provider Donnelly StagerM Xu, MD   Onset Date/Surgical Date 02/09/17   Next MD Visit 3 weeks   Prior Therapy No     Precautions   Precautions Other (comment)   Precaution Comments weight bearing with CAM walker and crutches   Required Braces or Orthoses --  Cam walker Lt      Restrictions   Weight Bearing Restrictions Yes   Other Position/Activity Restrictions WBAT with crutches nad CAM walker  NWB without     Balance Screen   Has the patient fallen in the past 6 months No     Prior Function   Level of Independence Requires assistive device for independence   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   Leisure badketball     Cognition   Overall Cognitive Status Within Functional Limits for tasks assessed     Observation/Other Assessments   Focus on Therapeutic Outcomes (FOTO)  63% LIMITED     ROM / Strength   AROM / PROM /  Strength AROM;Strength;PROM     AROM   AROM Assessment Site Ankle   Right/Left Ankle Right;Left   Right Ankle Dorsiflexion 100   Right Ankle Plantar Flexion 142   Right Ankle Inversion 24   Right Ankle Eversion 20   Left Ankle Dorsiflexion -30   Left Ankle Plantar Flexion 130   Left Ankle Inversion 10     Strength   Overall Strength Comments Not assessed due to no strengthening at this time     Palpation   Palpation comment Mildly tender along inciision with mild edema      Ambulation/Gait   Gait Comments 2 crutches , Cam walker, WB AT no pain             Objective measurements completed on examination: See above findings.                  PT Education - 02/24/17 1542    Education provided Yes   Education Details POC , HEP, NO PROM, WALK with crutches and CAM walker     Person(s) Educated Patient   Methods  Explanation;Demonstration;Verbal cues;Handout   Comprehension Verbalized understanding;Returned demonstration          PT Short Term Goals - 02/24/17 1536      PT SHORT TERM GOAL #1   Title He will be independent with inital HEP   Time 4   Period Weeks     PT SHORT TERM GOAL #2   Title He will report no increased pain with exercises   Time 4   Period Weeks   Status New     PT SHORT TERM GOAL #3   Title He will have 90 degrees active LT ankle DF   Time 4   Period Weeks   Status New           PT Long Term Goals - 02/24/17 1537      PT LONG TERM GOAL #1   Title He iwll be independnet with all HEP issued   Time 16   Period Weeks   Status New     PT LONG TERM GOAL #2   Title He wil have 2-3 max pain with normal daily activity   Time 16   Period Weeks   Status New     PT LONG TERM GOAL #3   Title He will return to work full duty.    Time 16   Period Weeks   Status New     PT LONG TERM GOAL #4   Title He will have active Lt ankle ROM to equal RT to return to normal walking activity   Time 16   Period Weeks   Status New     PT LONG TERM GOAL #5   Title He wil begin to jog and cut if allowed by MD    Time 16   Period Weeks   Status New     PT LONG TERM GOAL #6   Title He will be able to walk up and down stairs step over step with one rail   Time 16   Period Weeks   Status New                Plan - 02/24/17 1443    Clinical Impression Statement Mr Corlett presents for eval post repair of LT heel cord with pain stiffness and decreased ability to walk with resitrictions in weight bearing   Clinical Presentation Stable   Clinical Decision Making Low  Rehab Potential Good   PT Frequency 1x / week  for 4 weeks due to limited  in protocol   PT Duration Other (comment)  then 2-3x/week for 12 weeks    PT Treatment/Interventions Cryotherapy;Passive range of motion;Patient/family education;Stair training;Gait training;Therapeutic  exercise;Therapeutic activities;Manual techniques   PT Next Visit Plan HEP , manual , vaso, active ROM   Consulted and Agree with Plan of Care Patient     TREATMENT PER PROTOCOL Patient will benefit from skilled therapeutic intervention in order to improve the following deficits and impairments:  Decreased range of motion, Difficulty walking, Pain, Increased edema, Decreased strength, Decreased activity tolerance  Visit Diagnosis: Status post Achilles tendon repair  Stiffness of left ankle, not elsewhere classified  Muscle weakness (generalized)  Difficulty in walking, not elsewhere classified  Localized edema  Pain in left ankle and joints of left foot     Problem List Patient Active Problem List   Diagnosis Date Noted  . Achilles rupture, left 02/07/2017    Caprice Red  pt 02/24/2017, 3:48 PM  [facility] [address] [place], [place], [address] Phone: [phone]   Fax:  [phone]  Name: Ripley Bogosian MRN: [id number] Date of Birth: 09-23-1985

## 2017-03-07 ENCOUNTER — Ambulatory Visit: Payer: Self-pay | Attending: Orthopaedic Surgery

## 2017-03-07 DIAGNOSIS — M6281 Muscle weakness (generalized): Secondary | ICD-10-CM | POA: Insufficient documentation

## 2017-03-07 DIAGNOSIS — Z9889 Other specified postprocedural states: Secondary | ICD-10-CM | POA: Insufficient documentation

## 2017-03-07 DIAGNOSIS — M25572 Pain in left ankle and joints of left foot: Secondary | ICD-10-CM | POA: Insufficient documentation

## 2017-03-07 DIAGNOSIS — R262 Difficulty in walking, not elsewhere classified: Secondary | ICD-10-CM | POA: Insufficient documentation

## 2017-03-07 DIAGNOSIS — M25672 Stiffness of left ankle, not elsewhere classified: Secondary | ICD-10-CM | POA: Insufficient documentation

## 2017-03-07 DIAGNOSIS — R600 Localized edema: Secondary | ICD-10-CM | POA: Insufficient documentation

## 2017-03-07 NOTE — Therapy (Signed)
[facility] [address] [place], [place], [address] Phone: [phone]   Fax:  [phone]  Physical Therapy Treatment  Patient Details  Name: Gregory HerrlichJarrod Springer MRN: [id number] Date of Birth: 08/03/1986 Referring Provider: Donnelly StagerM Xu, MD  Encounter Date: 03/07/2017      PT End of Session - 03/07/17 1102    Visit Number 2   Number of Visits 29   Date for PT Re-Evaluation 06/17/17   Authorization Type Self pay   PT Start Time 1100   PT Stop Time 1138   PT Time Calculation (min) 38 min   Activity Tolerance Patient tolerated treatment well   Behavior During Therapy [facility] for tasks assessed/performed      Past Medical History:  Diagnosis Date  . Achilles rupture, left 01/26/2017    Past Surgical History:  Procedure Laterality Date  . ACHILLES TENDON SURGERY Left 02/09/2017   Procedure: LEFT ACHILLES TENDON REPAIR;  Surgeon: Tarry KosXu, Naiping M, MD;  Location: [facility];  Service: Orthopedics;  Laterality: Left;  . NO PAST SURGERIES      There were no vitals filed for this visit.      Subjective Assessment - 03/07/17 1126    Subjective Feeling Ok . Continue with swelling and stiffness            [facility] PT Assessment - 03/07/17 0001      AROM   Left Ankle Dorsiflexion -12   Left Ankle Plantar Flexion 145                     [facility] Adult PT Treatment/Exercise - 03/07/17 0001      Exercises   Exercises Ankle     Knee/Hip Exercises: Supine   Straight Leg Raises 20 reps;Left     Knee/Hip Exercises: Sidelying   Hip ABduction Left;20 reps   Hip ADduction Left;20 reps     Knee/Hip Exercises: Prone   Straight Leg Raises Left;20 reps     Ankle Exercises: Stretches   Other Stretch Active DF/PF and inversion /Eversion x 20                  PT Short Term Goals - 02/24/17 1536      PT SHORT TERM GOAL #1   Title He will be independent with inital HEP   Time 4   Period Weeks     PT  SHORT TERM GOAL #2   Title He will report no increased pain with exercises   Time 4   Period Weeks   Status New     PT SHORT TERM GOAL #3   Title He will have 90 degrees active LT ankle DF   Time 4   Period Weeks   Status New           PT Long Term Goals - 02/24/17 1537      PT LONG TERM GOAL #1   Title He iwll be independnet with all HEP issued   Time 16   Period Weeks   Status New     PT LONG TERM GOAL #2   Title He wil have 2-3 max pain with normal daily activity   Time 16   Period Weeks   Status New     PT LONG TERM GOAL #3   Title He will return to work full duty.    Time 16   Period Weeks   Status New     PT LONG TERM GOAL #4  Title He will have active Lt ankle ROM to equal RT to return to normal walking activity   Time 16   Period Weeks   Status New     PT LONG TERM GOAL #5   Title He wil begin to jog and cut if allowed by MD    Time 16   Period Weeks   Status New     PT LONG TERM GOAL #6   Title He will be able to walk up and down stairs step over step with one rail   Time 16   Period Weeks   Status New               Plan - 03/07/17 1107    Clinical Impression Statement Progressing in protocol.  ROM increased.  Still under 4 weeks so stayed within this activity level.    PT Treatment/Interventions Cryotherapy;Passive range of motion;Patient/family education;Stair training;Gait training;Therapeutic exercise;Therapeutic activities;Manual techniques   PT Next Visit Plan HEP , manual , vaso, active ROM, progress to 4-6 weeks in protocol   PT Home Exercise Plan 4 way SLR.   active DF/Pf / INVER/ EVER   Consulted and Agree with Plan of Care Patient      Patient will benefit from skilled therapeutic intervention in order to improve the following deficits and impairments:  Decreased range of motion, Difficulty walking, Pain, Increased edema, Decreased strength, Decreased activity tolerance  Visit Diagnosis: Status post Achilles tendon  repair  Stiffness of left ankle, not elsewhere classified  Muscle weakness (generalized)  Difficulty in walking, not elsewhere classified  Localized edema  Pain in left ankle and joints of left foot     Problem List Patient Active Problem List   Diagnosis Date Noted  . Achilles rupture, left 02/07/2017    Caprice Red  PT 03/07/2017, 11:35 AM  [facility] [address] [place], [place], [address] Phone: [phone]   Fax:  [phone]  Name: Rock Sobol MRN: [id number] Date of Birth: Oct 10, 1985

## 2017-03-14 ENCOUNTER — Ambulatory Visit: Payer: Self-pay

## 2017-03-20 ENCOUNTER — Other Ambulatory Visit (INDEPENDENT_AMBULATORY_CARE_PROVIDER_SITE_OTHER): Payer: Self-pay | Admitting: Orthopaedic Surgery

## 2017-03-21 ENCOUNTER — Encounter (INDEPENDENT_AMBULATORY_CARE_PROVIDER_SITE_OTHER): Payer: Self-pay | Admitting: Orthopaedic Surgery

## 2017-03-21 ENCOUNTER — Ambulatory Visit (INDEPENDENT_AMBULATORY_CARE_PROVIDER_SITE_OTHER): Payer: Self-pay | Admitting: Orthopaedic Surgery

## 2017-03-21 ENCOUNTER — Ambulatory Visit: Payer: Self-pay

## 2017-03-21 DIAGNOSIS — R262 Difficulty in walking, not elsewhere classified: Secondary | ICD-10-CM

## 2017-03-21 DIAGNOSIS — M6281 Muscle weakness (generalized): Secondary | ICD-10-CM

## 2017-03-21 DIAGNOSIS — M25672 Stiffness of left ankle, not elsewhere classified: Secondary | ICD-10-CM

## 2017-03-21 DIAGNOSIS — R600 Localized edema: Secondary | ICD-10-CM

## 2017-03-21 DIAGNOSIS — Z9889 Other specified postprocedural states: Secondary | ICD-10-CM

## 2017-03-21 DIAGNOSIS — M25572 Pain in left ankle and joints of left foot: Secondary | ICD-10-CM

## 2017-03-21 DIAGNOSIS — S86012A Strain of left Achilles tendon, initial encounter: Secondary | ICD-10-CM

## 2017-03-21 NOTE — Therapy (Signed)
[facility] [address] [place], [place], [address] Phone: [phone]   Fax:  [phone]  Physical Therapy Treatment  Patient Details  Name: Lucendia HerrlichJarrod Wendel MRN: [id number] Date of Birth: 02/14/1986 Referring Provider: Donnelly StagerM Xu, MD  Encounter Date: 03/21/2017      PT End of Session - 03/21/17 1100    Visit Number 3   Number of Visits 29   Date for PT Re-Evaluation 06/17/17   Authorization Type Self pay   PT Start Time 1057   PT Stop Time 1135   PT Time Calculation (min) 38 min   Activity Tolerance Patient tolerated treatment well   Behavior During Therapy [facility] for tasks assessed/performed      Past Medical History:  Diagnosis Date  . Achilles rupture, left 01/26/2017    Past Surgical History:  Procedure Laterality Date  . ACHILLES TENDON SURGERY Left 02/09/2017   Procedure: LEFT ACHILLES TENDON REPAIR;  Surgeon: Tarry KosXu, Naiping M, MD;  Location: [facility];  Service: Orthopedics;  Laterality: Left;  . NO PAST SURGERIES      There were no vitals filed for this visit.      Subjective Assessment - 03/21/17 1058    Subjective MD removed the heel lift today and said could weight bear as tolerated without crutches if comfortable .     Return to MD in 6 weeks.    Currently in Pain? Yes   Pain Score 1    Pain Location Heel  along incision   Pain Orientation Left   Pain Descriptors / Indicators Aching   Pain Type Surgical pain   Pain Onset More than a month ago   Pain Frequency Intermittent   Aggravating Factors  weight bearing   Pain Relieving Factors rest   Multiple Pain Sites No            [facility] PT Assessment - 03/21/17 0001      AROM   Left Ankle Dorsiflexion -10   Left Ankle Plantar Flexion 141   Left Ankle Inversion 22  eversion  12                     [facility] Adult PT Treatment/Exercise - 03/21/17 0001      Manual Therapy   Manual therapy comments gentle STW and scar mobs,  inversion /eversion stretching foot < 90 derees, gentl mobs to forefoot. and toes.      Ankle Exercises: Stretches   Other Stretch Active DF/PF and inversion /Eversion  multiple reps and hold times.  No passive until next visit                  PT Short Term Goals - 02/24/17 1536      PT SHORT TERM GOAL #1   Title He will be independent with inital HEP   Time 4   Period Weeks     PT SHORT TERM GOAL #2   Title He will report no increased pain with exercises   Time 4   Period Weeks   Status New     PT SHORT TERM GOAL #3   Title He will have 90 degrees active LT ankle DF   Time 4   Period Weeks   Status New           PT Long Term Goals - 02/24/17 1537      PT LONG TERM GOAL #1   Title He iwll be independnet with all HEP issued  Time 16   Period Weeks   Status New     PT LONG TERM GOAL #2   Title He wil have 2-3 max pain with normal daily activity   Time 16   Period Weeks   Status New     PT LONG TERM GOAL #3   Title He will return to work full duty.    Time 16   Period Weeks   Status New     PT LONG TERM GOAL #4   Title He will have active Lt ankle ROM to equal RT to return to normal walking activity   Time 16   Period Weeks   Status New     PT LONG TERM GOAL #5   Title He wil begin to jog and cut if allowed by MD    Time 16   Period Weeks   Status New     PT LONG TERM GOAL #6   Title He will be able to walk up and down stairs step over step with one rail   Time 16   Period Weeks   Status New               Plan - 03/21/17 1100    Clinical Impression Statement Still under 6 weeks so advance next visit   PT Next Visit Plan Advance to weeks 7 of protocol   PT Home Exercise Plan 4 way SLR.   active DF/Pf / INVER/ EVER   Consulted and Agree with Plan of Care Patient      Patient will benefit from skilled therapeutic intervention in order to improve the following deficits and impairments:     Visit Diagnosis: Status post  Achilles tendon repair  Stiffness of left ankle, not elsewhere classified  Muscle weakness (generalized)  Difficulty in walking, not elsewhere classified  Localized edema  Pain in left ankle and joints of left foot     Problem List Patient Active Problem List   Diagnosis Date Noted  . Achilles rupture, left 02/07/2017    Caprice Red  PT 03/21/2017, 11:44 AM  [facility] [address] [place], [place], [address] Phone: [phone]   Fax:  [phone]  Name: Kyair Ditommaso MRN: [id number] Date of Birth: 09-Feb-1986

## 2017-03-21 NOTE — Progress Notes (Signed)
Patient is 6 weeks status post left Achilles repair. He is doing physical therapy once a week. Pain comes and goes. His surgical scar is fully healed. He does have slight Achilles contracture. No significant swelling. At this point I removed his to heal less. Continue with physical therapy per protocol. He can wean crutches. He can weight-bear as tolerated. Follow-up in 6 weeks for recheck.

## 2017-03-28 ENCOUNTER — Ambulatory Visit: Payer: Self-pay

## 2017-03-28 DIAGNOSIS — M25572 Pain in left ankle and joints of left foot: Secondary | ICD-10-CM

## 2017-03-28 DIAGNOSIS — M6281 Muscle weakness (generalized): Secondary | ICD-10-CM

## 2017-03-28 DIAGNOSIS — M25672 Stiffness of left ankle, not elsewhere classified: Secondary | ICD-10-CM

## 2017-03-28 DIAGNOSIS — Z9889 Other specified postprocedural states: Secondary | ICD-10-CM

## 2017-03-28 DIAGNOSIS — R600 Localized edema: Secondary | ICD-10-CM

## 2017-03-28 DIAGNOSIS — R262 Difficulty in walking, not elsewhere classified: Secondary | ICD-10-CM

## 2017-03-28 NOTE — Therapy (Signed)
[facility] [address] [place], [place], [address] Phone: [phone]   Fax:  [phone]  Physical Therapy Treatment  Patient Details  Name: Gregory Watts MRN: [id number] Date of Birth: Mar 16, 1986 Referring Provider: Donnelly Stager, MD  Encounter Date: 03/28/2017      PT End of Session - 03/28/17 1129    Visit Number 4   Number of Visits 29   Date for PT Re-Evaluation 06/17/17   Authorization Type Self pay   PT Start Time 1100   PT Stop Time 1138   PT Time Calculation (min) 38 min   Activity Tolerance Patient tolerated treatment well;No increased pain   Behavior During Therapy WFL for tasks assessed/performed      Past Medical History:  Diagnosis Date  . Achilles rupture, left 01/26/2017    Past Surgical History:  Procedure Laterality Date  . ACHILLES TENDON SURGERY Left 02/09/2017   Procedure: LEFT ACHILLES TENDON REPAIR;  Surgeon: Tarry Kos, MD;  Location: [facility];  Service: Orthopedics;  Laterality: Left;  . NO PAST SURGERIES      There were no vitals filed for this visit.      Subjective Assessment - 03/28/17 1100    Subjective Feels good to walk without crutches with CAM boot   Currently in Pain? Yes   Pain Score 2    Pain Location Heel   Pain Orientation Left   Pain Descriptors / Indicators Aching   Pain Type Surgical pain   Pain Onset More than a month ago   Pain Frequency Intermittent   Aggravating Factors  weight bearing   Pain Relieving Factors rest   Multiple Pain Sites No                         [facility] Adult PT Treatment/Exercise - 03/28/17 0001      Manual Therapy   Manual therapy comments gentle STW and scar mobs, inversion /eversion/ DF stretching foot < 90 derees, gentl mobs to forefoot. and toes.      Ankle Exercises: Stretches   Other Stretch Active DF/PF and inversion /Eversion  multiple reps and hold times.  No passive until next visit     Ankle  Exercises: Seated   Other Seated Ankle Exercises red band with DF , IN/EV x 15 reps                PT Education - 03/28/17 1128    Education provided Yes   Education Details HEP   Person(s) Educated Patient   Methods Explanation;Demonstration;Verbal cues;Handout   Comprehension Returned demonstration;Verbalized understanding          PT Short Term Goals - 03/28/17 1058      PT SHORT TERM GOAL #1   Title He will be independent with inital HEP   Status Achieved     PT SHORT TERM GOAL #2   Title He will report no increased pain with exercises   Status Achieved     PT SHORT TERM GOAL #3   Title He will have 90 degrees active LT ankle DF           PT Long Term Goals - 02/24/17 1537      PT LONG TERM GOAL #1   Title He iwll be independnet with all HEP issued   Time 16   Period Weeks   Status New     PT LONG TERM GOAL #2   Title He wil  have 2-3 max pain with normal daily activity   Time 16   Period Weeks   Status New     PT LONG TERM GOAL #3   Title He will return to work full duty.    Time 16   Period Weeks   Status New     PT LONG TERM GOAL #4   Title He will have active Lt ankle ROM to equal RT to return to normal walking activity   Time 16   Period Weeks   Status New     PT LONG TERM GOAL #5   Title He wil begin to jog and cut if allowed by MD    Time 16   Period Weeks   Status New     PT LONG TERM GOAL #6   Title He will be able to walk up and down stairs step over step with one rail   Time 16   Period Weeks   Status New               Plan - 03/28/17 1057    Clinical Impression Statement > 6 weeks so advanced protocol. Added to HEP with band and PROM DF, towel exercises red band DF/IN/EV  and stretch stand and with red band.  No pain post    PT Treatment/Interventions Cryotherapy;Passive range of motion;Patient/family education;Stair training;Gait training;Therapeutic exercise;Therapeutic activities;Manual techniques   PT Next  Visit Plan Advance to weeks 6-8 of protocol.. Start PF with band next week if no incr pain.    PT Home Exercise Plan 4 way SLR.   active DF/Pf / INVER/ EVER band ankle 3 way (no Pf)   gentle PROM DF and toewel exer   Consulted and Agree with Plan of Care Patient      Patient will benefit from skilled therapeutic intervention in order to improve the following deficits and impairments:  Decreased range of motion, Difficulty walking, Pain, Increased edema, Decreased strength, Decreased activity tolerance  Visit Diagnosis: Status post Achilles tendon repair  Stiffness of left ankle, not elsewhere classified  Muscle weakness (generalized)  Difficulty in walking, not elsewhere classified  Localized edema  Pain in left ankle and joints of left foot     Problem List Patient Active Problem List   Diagnosis Date Noted  . Achilles rupture, left 02/07/2017    Caprice Watts, Gregory Lofgren M  PT 03/28/2017, 11:50 AM  [facility] [address] [place], [place], [address] Phone: [phone]   Fax:  [phone]  Name: Gregory Watts MRN: [id number] Date of Birth: 08/05/1986

## 2017-03-28 NOTE — Patient Instructions (Signed)
Issued from cabinet  Red band DF/EV/INVER   Lt ankle 1-2x/day  Towel exer 1-2x/day 20-25 reps ,   Red band DF stretching 30 sec or more 6-8 x/day 2-3 reps

## 2017-04-01 ENCOUNTER — Ambulatory Visit: Payer: Self-pay | Admitting: Physical Therapy

## 2017-04-01 DIAGNOSIS — Z9889 Other specified postprocedural states: Secondary | ICD-10-CM

## 2017-04-01 DIAGNOSIS — R600 Localized edema: Secondary | ICD-10-CM

## 2017-04-01 DIAGNOSIS — M25572 Pain in left ankle and joints of left foot: Secondary | ICD-10-CM

## 2017-04-01 DIAGNOSIS — R262 Difficulty in walking, not elsewhere classified: Secondary | ICD-10-CM

## 2017-04-01 DIAGNOSIS — M25672 Stiffness of left ankle, not elsewhere classified: Secondary | ICD-10-CM

## 2017-04-01 DIAGNOSIS — M6281 Muscle weakness (generalized): Secondary | ICD-10-CM

## 2017-04-01 NOTE — Therapy (Signed)
[facility] [address] [place], [place], [address] Phone: [phone]   Fax:  [phone]  Physical Therapy Treatment  Patient Details  Name: Gregory Watts MRN: [id number] Date of Birth: 12/08/1985 Referring Provider: Donnelly StagerM Xu, MD  Encounter Date: 04/01/2017      PT End of Session - 04/01/17 1116    Visit Number 5   Number of Visits 29   Date for PT Re-Evaluation 06/17/17   Authorization Type Self pay   PT Start Time 1102   PT Stop Time 1140   PT Time Calculation (min) 38 min      Past Medical History:  Diagnosis Date  . Achilles rupture, left 01/26/2017    Past Surgical History:  Procedure Laterality Date  . ACHILLES TENDON SURGERY Left 02/09/2017   Procedure: LEFT ACHILLES TENDON REPAIR;  Surgeon: Tarry KosXu, Naiping M, MD;  Location: [facility];  Service: Orthopedics;  Laterality: Left;  . NO PAST SURGERIES      There were no vitals filed for this visit.      Subjective Assessment - 04/01/17 1116    Subjective No pain, just a little sore.    Currently in Pain? No/denies                         [facility] Adult PT Treatment/Exercise - 04/01/17 0001      Knee/Hip Exercises: Supine   Straight Leg Raises 20 reps;Left     Knee/Hip Exercises: Sidelying   Hip ABduction Left;20 reps   Hip ADduction Left;20 reps     Knee/Hip Exercises: Prone   Straight Leg Raises Left;20 reps     Manual Therapy   Manual therapy comments gentle STW and scar mobs, inversion /eversion/ DF stretching foot < 90 derees, gentl mobs to forefoot. and toes.      Ankle Exercises: Seated   Towel Crunch 5 reps   Towel Inversion/Eversion 5 reps   Marble Pickup able to pick up 4 marbles in 5 minutes    Other Seated Ankle Exercises red band with DF , IN/EV x 15 repsx 2                   PT Short Term Goals - 03/28/17 1058      PT SHORT TERM GOAL #1   Title He will be independent with inital HEP   Status  Achieved     PT SHORT TERM GOAL #2   Title He will report no increased pain with exercises   Status Achieved     PT SHORT TERM GOAL #3   Title He will have 90 degrees active LT ankle DF           PT Long Term Goals - 02/24/17 1537      PT LONG TERM GOAL #1   Title He iwll be independnet with all HEP issued   Time 16   Period Weeks   Status New     PT LONG TERM GOAL #2   Title He wil have 2-3 max pain with normal daily activity   Time 16   Period Weeks   Status New     PT LONG TERM GOAL #3   Title He will return to work full duty.    Time 16   Period Weeks   Status New     PT LONG TERM GOAL #4   Title He will have active Lt ankle ROM to equal RT to  return to normal walking activity   Time 16   Period Weeks   Status New     PT LONG TERM GOAL #5   Title He wil begin to jog and cut if allowed by MD    Time 16   Period Weeks   Status New     PT LONG TERM GOAL #6   Title He will be able to walk up and down stairs step over step with one rail   Time 16   Period Weeks   Status New               Plan - 04/01/17 1118    Clinical Impression Statement Pt reports no increased pain with new exercises. Much difficuly with marble pick up using toes. Only able to pick up 4 marbles in 5 minutes. Reviewed red and HEP and 4 way SLR, he was not completing hip adduction at home. No increased pain post session.    PT Next Visit Plan Advance to weeks 6-8 of protocol.. Start PF with band next week if no incr pain.    PT Home Exercise Plan 4 way SLR.   active DF/Pf / INVER/ EVER band ankle 3 way (no Pf)   gentle PROM DF and toewel exer   Consulted and Agree with Plan of Care Patient      Patient will benefit from skilled therapeutic intervention in order to improve the following deficits and impairments:  Decreased range of motion, Difficulty walking, Pain, Increased edema, Decreased strength, Decreased activity tolerance  Visit Diagnosis: Status post Achilles tendon  repair  Stiffness of left ankle, not elsewhere classified  Muscle weakness (generalized)  Difficulty in walking, not elsewhere classified  Localized edema  Pain in left ankle and joints of left foot     Problem List Patient Active Problem List   Diagnosis Date Noted  . Achilles rupture, left 02/07/2017    Sherrie Mustacheonoho, Crysta Gulick McGee, PTA 04/01/2017, 12:39 PM  [facility] [address] [place], [place], [address] Phone: [phone]   Fax:  [phone]  Name: Gregory Watts MRN: [id number] Date of Birth: 09/24/1985

## 2017-04-05 ENCOUNTER — Ambulatory Visit: Payer: Self-pay | Admitting: Physical Therapy

## 2017-04-05 DIAGNOSIS — Z9889 Other specified postprocedural states: Secondary | ICD-10-CM

## 2017-04-05 DIAGNOSIS — M25672 Stiffness of left ankle, not elsewhere classified: Secondary | ICD-10-CM

## 2017-04-05 DIAGNOSIS — R600 Localized edema: Secondary | ICD-10-CM

## 2017-04-05 DIAGNOSIS — M25572 Pain in left ankle and joints of left foot: Secondary | ICD-10-CM

## 2017-04-05 DIAGNOSIS — M6281 Muscle weakness (generalized): Secondary | ICD-10-CM

## 2017-04-05 DIAGNOSIS — R262 Difficulty in walking, not elsewhere classified: Secondary | ICD-10-CM

## 2017-04-05 NOTE — Therapy (Signed)
[facility] [address] [place], [place], [address] Phone: [phone]   Fax:  [phone]  Physical Therapy Treatment  Patient Details  Name: Gregory Watts MRN: [id number] Date of Birth: 09/08/1985 Referring Provider: Donnelly StagerM Xu, MD  Encounter Date: 04/05/2017      PT End of Session - 04/05/17 1034    Visit Number 6   Number of Visits 29   Date for PT Re-Evaluation 06/17/17   Authorization Type Self pay   PT Start Time 1017   PT Stop Time 1105   PT Time Calculation (min) 48 min      Past Medical History:  Diagnosis Date  . Achilles rupture, left 01/26/2017    Past Surgical History:  Procedure Laterality Date  . ACHILLES TENDON SURGERY Left 02/09/2017   Procedure: LEFT ACHILLES TENDON REPAIR;  Surgeon: Tarry KosXu, Naiping M, MD;  Location: [facility];  Service: Orthopedics;  Laterality: Left;  . NO PAST SURGERIES      There were no vitals filed for this visit.      Subjective Assessment - 04/05/17 1033    Subjective doing fine   Currently in Pain? No/denies                         [facility] Adult PT Treatment/Exercise - 04/05/17 0001      Modalities   Modalities Moist Heat     Moist Heat Therapy   Number Minutes Moist Heat 5 Minutes     Manual Therapy   Manual therapy comments PROM DF 3 x 30 sec, scar massage, cross friction, IASTM to achilles and distal calf     Ankle Exercises: Seated   Towel Crunch 5 reps   Towel Inversion/Eversion 5 reps   Marble Pickup able to pick up all but one marble    Heel Raises 15 reps   BAPS Level 2;Sitting   Other Seated Ankle Exercises yellow band PF      Ankle Exercises: Standing   Other Standing Ankle Exercises weight shift to LLE                  PT Short Term Goals - 03/28/17 1058      PT SHORT TERM GOAL #1   Title He will be independent with inital HEP   Status Achieved     PT SHORT TERM GOAL #2   Title He will report no  increased pain with exercises   Status Achieved     PT SHORT TERM GOAL #3   Title He will have 90 degrees active LT ankle DF           PT Long Term Goals - 02/24/17 1537      PT LONG TERM GOAL #1   Title He iwll be independnet with all HEP issued   Time 16   Period Weeks   Status New     PT LONG TERM GOAL #2   Title He wil have 2-3 max pain with normal daily activity   Time 16   Period Weeks   Status New     PT LONG TERM GOAL #3   Title He will return to work full duty.    Time 16   Period Weeks   Status New     PT LONG TERM GOAL #4   Title He will have active Lt ankle ROM to equal RT to return to normal walking activity   Time 16  Period Weeks   Status New     PT LONG TERM GOAL #5   Title He wil begin to jog and cut if allowed by MD    Time 16   Period Weeks   Status New     PT LONG TERM GOAL #6   Title He will be able to walk up and down stairs step over step with one rail   Time 16   Period Weeks   Status New               Plan - 04/05/17 1247    Clinical Impression Statement 7 weeks post op. Asked pt to bring sneaker next visit. Began weight shifting out of cam boot. Pt reports walking some around home without boot. Began PF with yellow theraband and issued for HEP. Used HMP prior to manual to increase DF flexibility. Asked pt to increase stretching frequency to 3 x per day. PROM DF to neutral.    PT Next Visit Plan Advance to weeks 6-8 of protocol.. check response to PF with band, manual to increase DF    PT Home Exercise Plan 4 way SLR.   active DF/Pf / INVER/ EVER band ankle 3 way (no Pf)   gentle PROM DF and toewel exer, yellow band PF    Consulted and Agree with Plan of Care Patient      Patient will benefit from skilled therapeutic intervention in order to improve the following deficits and impairments:  Decreased range of motion, Difficulty walking, Pain, Increased edema, Decreased strength, Decreased activity tolerance  Visit  Diagnosis: Status post Achilles tendon repair  Stiffness of left ankle, not elsewhere classified  Muscle weakness (generalized)  Difficulty in walking, not elsewhere classified  Localized edema  Pain in left ankle and joints of left foot     Problem List Patient Active Problem List   Diagnosis Date Noted  . Achilles rupture, left 02/07/2017    Sherrie Mustacheonoho, Jamecia Lerman McGee, PTA 04/05/2017, 12:53 PM  [facility] [address] [place], [place], [address] Phone: [phone]   Fax:  [phone]  Name: Gregory Watts MRN: [id number] Date of Birth: 12/07/1985

## 2017-04-07 ENCOUNTER — Ambulatory Visit: Payer: Self-pay | Attending: Orthopaedic Surgery | Admitting: Physical Therapy

## 2017-04-07 ENCOUNTER — Encounter: Payer: Self-pay | Admitting: Physical Therapy

## 2017-04-07 DIAGNOSIS — Z9889 Other specified postprocedural states: Secondary | ICD-10-CM | POA: Insufficient documentation

## 2017-04-07 DIAGNOSIS — M6281 Muscle weakness (generalized): Secondary | ICD-10-CM | POA: Insufficient documentation

## 2017-04-07 DIAGNOSIS — R600 Localized edema: Secondary | ICD-10-CM | POA: Insufficient documentation

## 2017-04-07 DIAGNOSIS — R262 Difficulty in walking, not elsewhere classified: Secondary | ICD-10-CM | POA: Insufficient documentation

## 2017-04-07 DIAGNOSIS — M25672 Stiffness of left ankle, not elsewhere classified: Secondary | ICD-10-CM | POA: Insufficient documentation

## 2017-04-07 DIAGNOSIS — M25572 Pain in left ankle and joints of left foot: Secondary | ICD-10-CM | POA: Insufficient documentation

## 2017-04-07 NOTE — Therapy (Signed)
[facility] [address] [place], [place], [address] Phone: [phone]   Fax:  [phone]  Physical Therapy Treatment  Patient Details  Name: Gregory HerrlichJarrod Ehmann MRN: [id number] Date of Birth: 08/08/1986 Referring Provider: Donnelly StagerM Xu, MD  Encounter Date: 04/07/2017      PT End of Session - 04/07/17 1651    Visit Number 7   Number of Visits 29   Date for PT Re-Evaluation 06/17/17   Authorization Type Self pay   PT Start Time 1100   PT Stop Time 1143   PT Time Calculation (min) 43 min   Activity Tolerance Patient tolerated treatment well   Behavior During Therapy [facility] for tasks assessed/performed      Past Medical History:  Diagnosis Date  . Achilles rupture, left 01/26/2017    Past Surgical History:  Procedure Laterality Date  . ACHILLES TENDON SURGERY Left 02/09/2017   Procedure: LEFT ACHILLES TENDON REPAIR;  Surgeon: Tarry KosXu, Naiping M, MD;  Location: [facility];  Service: Orthopedics;  Laterality: Left;  . NO PAST SURGERIES      There were no vitals filed for this visit.      Subjective Assessment - 04/07/17 1650    Subjective Patient has no complaints.    Limitations Walking;Standing;House hold activities   How long can you sit comfortably? PRN   How long can you stand comfortably? As needed   How long can you walk comfortably? As needed   Patient Stated Goals Get heel back right.  He wants to be ale to walk  without device,  jog /run,  return to work and leisure activity   Currently in Pain? No/denies                         [facility] Adult PT Treatment/Exercise - 04/07/17 0001      Manual Therapy   Manual Therapy Joint mobilization;Soft tissue mobilization;Passive ROM   Joint Mobilization Anterior drawer glides to improve DF    Soft tissue mobilization IASTM to gastroc to improve muscle length    Passive ROM PROM into DF      Ankle Exercises: Seated   Other Seated Ankle Exercises  yellow band 4 way 2x15 each direction.      Ankle Exercises: Standing   Other Standing Ankle Exercises forward weight shift 2x10; lateral weight shift  with vcuing to come to midline 2x10; Slow march 2x10 bilateral                PT Education - 04/07/17 1651    Education provided Yes   Education Details updated HEP for standing weight shifting exercises.    Person(s) Educated Patient   Methods Explanation;Demonstration;Tactile cues;Verbal cues   Comprehension Verbalized understanding;Returned demonstration;Verbal cues required;Tactile cues required          PT Short Term Goals - 03/28/17 1058      PT SHORT TERM GOAL #1   Title He will be independent with inital HEP   Status Achieved     PT SHORT TERM GOAL #2   Title He will report no increased pain with exercises   Status Achieved     PT SHORT TERM GOAL #3   Title He will have 90 degrees active LT ankle DF           PT Long Term Goals - 02/24/17 1537      PT LONG TERM GOAL #1   Title He iwll be independnet with all  HEP issued   Time 16   Period Weeks   Status New     PT LONG TERM GOAL #2   Title He wil have 2-3 max pain with normal daily activity   Time 16   Period Weeks   Status New     PT LONG TERM GOAL #3   Title He will return to work full duty.    Time 16   Period Weeks   Status New     PT LONG TERM GOAL #4   Title He will have active Lt ankle ROM to equal RT to return to normal walking activity   Time 16   Period Weeks   Status New     PT LONG TERM GOAL #5   Title He wil begin to jog and cut if allowed by MD    Time 16   Period Weeks   Status New     PT LONG TERM GOAL #6   Title He will be able to walk up and down stairs step over step with one rail   Time 16   Period Weeks   Status New               Plan - 04/07/17 2049    Clinical Impression Statement Patient tolerated standing exercises well. Per protocol the patient can begin weaning out of his boot. Therapy gave  him exercises to begin him weaning out of his boot. He also perfromed 4 way ankle strengthening with no pain. His DF continues to be limited   Clinical Presentation Stable   Clinical Decision Making Low   Rehab Potential Good   PT Treatment/Interventions Cryotherapy;Passive range of motion;Patient/family education;Stair training;Gait training;Therapeutic exercise;Therapeutic activities;Manual techniques   PT Next Visit Plan Advance to weeks 6-8 of protocol.. check response to PF with band, manual to increase DF    PT Home Exercise Plan 4 way SLR.   active DF/Pf / INVER/ EVER band ankle 3 way (no Pf)   gentle PROM DF and toewel exer, yellow band PF    Consulted and Agree with Plan of Care Patient      Patient will benefit from skilled therapeutic intervention in order to improve the following deficits and impairments:  Decreased range of motion, Difficulty walking, Pain, Increased edema, Decreased strength, Decreased activity tolerance  Visit Diagnosis: Status post Achilles tendon repair  Stiffness of left ankle, not elsewhere classified  Muscle weakness (generalized)  Difficulty in walking, not elsewhere classified  Localized edema  Pain in left ankle and joints of left foot     Problem List Patient Active Problem List   Diagnosis Date Noted  . Achilles rupture, left 02/07/2017    Dessie Comaavid J Setsuko Robins PT DPT  04/07/2017, 8:55 PM  [facility] [address] [place], [place], [address] Phone: [phone]   Fax:  [phone]  Name: Gregory HerrlichJarrod Watts MRN: [id number] Date of Birth: 11/02/1985

## 2017-04-12 ENCOUNTER — Ambulatory Visit: Payer: Self-pay

## 2017-04-12 DIAGNOSIS — M25672 Stiffness of left ankle, not elsewhere classified: Secondary | ICD-10-CM

## 2017-04-12 DIAGNOSIS — M6281 Muscle weakness (generalized): Secondary | ICD-10-CM

## 2017-04-12 DIAGNOSIS — Z9889 Other specified postprocedural states: Secondary | ICD-10-CM

## 2017-04-12 DIAGNOSIS — R262 Difficulty in walking, not elsewhere classified: Secondary | ICD-10-CM

## 2017-04-12 DIAGNOSIS — M25572 Pain in left ankle and joints of left foot: Secondary | ICD-10-CM

## 2017-04-12 DIAGNOSIS — R600 Localized edema: Secondary | ICD-10-CM

## 2017-04-12 NOTE — Therapy (Signed)
[facility] [address] [place], [place], [address] Phone: [phone]   Fax:  [phone]  Physical Therapy Treatment  Patient Details  Name: Gregory Watts MRN: [id number] Date of Birth: 08-25-1986 Referring Provider: Donnelly Stager, MD  Encounter Date: 04/12/2017      PT End of Session - 04/12/17 1057    Visit Number 8   Number of Visits 29   Date for PT Re-Evaluation 06/17/17   Authorization Type Self pay   PT Start Time 1015   PT Stop Time 1056   PT Time Calculation (min) 41 min   Activity Tolerance Patient tolerated treatment well   Behavior During Therapy [facility] for tasks assessed/performed      Past Medical History:  Diagnosis Date  . Achilles rupture, left 01/26/2017    Past Surgical History:  Procedure Laterality Date  . ACHILLES TENDON SURGERY Left 02/09/2017   Procedure: LEFT ACHILLES TENDON REPAIR;  Surgeon: Gregory Kos, MD;  Location: [facility];  Service: Orthopedics;  Laterality: Left;  . NO PAST SURGERIES      There were no vitals filed for this visit.      Subjective Assessment - 04/12/17 1020    Subjective No pain   Currently in Pain? No/denies            [facility] PT Assessment - 04/12/17 0001      AROM   Left Ankle Dorsiflexion 92                     [facility] Adult PT Treatment/Exercise - 04/12/17 0001      Ambulation/Gait   Gait Comments walk no device or CAM boot slow without pain , cautioned pt to stop if any pain..      Knee/Hip Exercises: Aerobic   Nustep L3 slow gentl ROm to tolerance  5 min, no pain     Manual Therapy   Joint Mobilization Anterior drawer glides in standing to improve DF    Soft tissue mobilization IASTM to gastroc to improve muscle length    Passive ROM PROM into DF      Ankle Exercises: Standing   Other Standing Ankle Exercises forward weight shift x10; lateral weight shift  with vcuing to come to midline 10; Slow march 2x10 bilateral                   PT Short Term Goals - 04/12/17 1101      PT SHORT TERM GOAL #3   Title He will have 90 degrees active LT ankle DF   Status Achieved           PT Long Term Goals - 02/24/17 1537      PT LONG TERM GOAL #1   Title He iwll be independnet with all HEP issued   Time 16   Period Weeks   Status New     PT LONG TERM GOAL #2   Title He wil have 2-3 max pain with normal daily activity   Time 16   Period Weeks   Status New     PT LONG TERM GOAL #3   Title He will return to work full duty.    Time 16   Period Weeks   Status New     PT LONG TERM GOAL #4   Title He will have active Lt ankle ROM to equal RT to return to normal walking activity   Time 16   Period Weeks  Status New     PT LONG TERM GOAL #5   Title He wil begin to jog and cut if allowed by MD    Time 16   Period Weeks   Status New     PT LONG TERM GOAL #6   Title He will be able to walk up and down stairs step over step with one rail   Time 16   Period Weeks   Status New               Plan - 04/12/17 1058    Clinical Impression Statement No pain post session . Incr ROM active and passive. Progressing with ambulation . Cautioned to progress carefully with no pain.    PT Treatment/Interventions Cryotherapy;Passive range of motion;Patient/family education;Stair training;Gait training;Therapeutic exercise;Therapeutic activities;Manual techniques   PT Next Visit Plan Advance to weeks 8- 10 of protocol.. check , manual to increase DF progress strength without pain , gait withut pain. balance   PT Home Exercise Plan 4 way SLR.   active DF/Pf / INVER/ EVER band ankle 3 way (no Pf)   gentle PROM DF and toewel exer, yellow band PF    Consulted and Agree with Plan of Care Patient      Patient will benefit from skilled therapeutic intervention in order to improve the following deficits and impairments:  Decreased range of motion, Difficulty walking, Pain, Increased edema, Decreased  strength, Decreased activity tolerance  Visit Diagnosis: Status post Achilles tendon repair  Stiffness of left ankle, not elsewhere classified  Muscle weakness (generalized)  Difficulty in walking, not elsewhere classified  Localized edema  Pain in left ankle and joints of left foot     Problem List Patient Active Problem List   Diagnosis Date Noted  . Achilles rupture, left 02/07/2017    Gregory Watts, Gregory Watts  PT 04/12/2017, 11:02 AM  [facility] [address] [place], [place], [address] Phone: [phone]   Fax:  [phone]  Name: Gregory Watts MRN: [id number] Date of Birth: 08/26/1986

## 2017-04-14 ENCOUNTER — Ambulatory Visit: Payer: Self-pay

## 2017-04-14 DIAGNOSIS — Z9889 Other specified postprocedural states: Secondary | ICD-10-CM

## 2017-04-14 DIAGNOSIS — M25572 Pain in left ankle and joints of left foot: Secondary | ICD-10-CM

## 2017-04-14 DIAGNOSIS — R600 Localized edema: Secondary | ICD-10-CM

## 2017-04-14 DIAGNOSIS — M6281 Muscle weakness (generalized): Secondary | ICD-10-CM

## 2017-04-14 DIAGNOSIS — M25672 Stiffness of left ankle, not elsewhere classified: Secondary | ICD-10-CM

## 2017-04-14 DIAGNOSIS — R262 Difficulty in walking, not elsewhere classified: Secondary | ICD-10-CM

## 2017-04-14 NOTE — Patient Instructions (Signed)
Issued standing toe /fore foot r  Cautioned to stop if painaises, arch raises, standing balance 10-20 eps time as tolerated  2-3x/day

## 2017-04-14 NOTE — Therapy (Signed)
[facility] [address] [place], [place], [address] Phone: [phone]   Fax:  [phone]  Physical Therapy Treatment  Patient Details  Name: Gregory HerrlichJarrod Latin MRN: [id number] Date of Birth: 08/28/1986 Referring Provider: Donnelly StagerM Xu, MD  Encounter Date: 04/14/2017      PT End of Session - 04/14/17 1021    Visit Number 9   Number of Visits 29   Date for PT Re-Evaluation 06/17/17   Authorization Type Self pay   PT Start Time 1015   PT Stop Time 1100   PT Time Calculation (min) 45 min   Activity Tolerance Patient tolerated treatment well   Behavior [facility] assessed/performed      Past Medical History:  Diagnosis Date  . Achilles rupture, left 01/26/2017    Past Surgical History:  Procedure Laterality Date  . ACHILLES TENDON SURGERY Left 02/09/2017   Procedure: LEFT ACHILLES TENDON REPAIR;  Surgeon: Tarry KosXu, Naiping M, MD;  Location: [facility];  Service: Orthopedics;  Laterality: Left;  . NO PAST SURGERIES      There were no vitals filed for this visit.      Subjective Assessment - 04/14/17 1022    Subjective No complaints                         [facility] Adult PT Treatment/Exercise - 04/14/17 0001      Knee/Hip Exercises: Aerobic   Nustep L3 slow gentl ROM to tolerance  6 min, no pain     Manual Therapy   Joint Mobilization Anterior drawer glides in standing to improve DF    Soft tissue mobilization IASTM to gastroc to improve muscle length    Passive ROM PROM into DF      Ankle Exercises: Standing   Toe Raise 15 reps   Other Standing Ankle Exercises forward weight shift x10; lateral weight shift  with vcuing to come to midline 10; Slow march 2x10 bilateral                PT Education - 04/14/17 1109    Education provided Yes   Education Details HEP   Person(s) Educated Patient   Methods Explanation;Demonstration;Verbal cues;Handout   Comprehension  Returned demonstration;Verbalized understanding          PT Short Term Goals - 04/12/17 1101      PT SHORT TERM GOAL #3   Title He will have 90 degrees active LT ankle DF   Status Achieved           PT Long Term Goals - 02/24/17 1537      PT LONG TERM GOAL #1   Title He iwll be independnet with all HEP issued   Time 16   Period Weeks   Status New     PT LONG TERM GOAL #2   Title He wil have 2-3 max pain with normal daily activity   Time 16   Period Weeks   Status New     PT LONG TERM GOAL #3   Title He will return to work full duty.    Time 16   Period Weeks   Status New     PT LONG TERM GOAL #4   Title He will have active Lt ankle ROM to equal RT to return to normal walking activity   Time 16   Period Weeks   Status New     PT LONG TERM GOAL #5  Title He wil begin to jog and cut if allowed by MD    Time 16   Period Weeks   Status New     PT LONG TERM GOAL #6   Title He will be able to walk up and down stairs step over step with one rail   Time 16   Period Weeks   Status New               Plan - 04/14/17 1021    Clinical Impression Statement Progressing nicely with no pain and incr DF ROm amd tolerance to activity on feet without pain.  Progress slowly with no pain   PT Treatment/Interventions Cryotherapy;Passive range of motion;Patient/family education;Stair training;Gait training;Therapeutic exercise;Therapeutic activities;Manual techniques   PT Next Visit Plan Advance to weeks 8- 10 of protocol.. , manual to increase DF progress strength without pain , gait without pain. balance   PT Home Exercise Plan 4 way SLR.   active DF/Pf / INVER/ EVER band ankle 3 way (no Pf)   gentle PROM DF and toewel exer, yellow band PF    Consulted and Agree with Plan of Care Patient      Patient will benefit from skilled therapeutic intervention in order to improve the following deficits and impairments:  Decreased range of motion, Difficulty walking, Pain,  Increased edema, Decreased strength, Decreased activity tolerance  Visit Diagnosis: Status post Achilles tendon repair  Stiffness of left ankle, not elsewhere classified  Muscle weakness (generalized)  Difficulty in walking, not elsewhere classified  Localized edema  Pain in left ankle and joints of left foot     Problem List Patient Active Problem List   Diagnosis Date Noted  . Achilles rupture, left 02/07/2017    Caprice Red  PT 04/14/2017, 11:12 AM  [facility] [address] [place], [place], [address] Phone: [phone]   Fax:  [phone]  Name: Gregory Watts MRN: [id number] Date of Birth: April 17, 1986

## 2017-04-19 ENCOUNTER — Ambulatory Visit: Payer: Self-pay

## 2017-04-19 DIAGNOSIS — R262 Difficulty in walking, not elsewhere classified: Secondary | ICD-10-CM

## 2017-04-19 DIAGNOSIS — R600 Localized edema: Secondary | ICD-10-CM

## 2017-04-19 DIAGNOSIS — M25672 Stiffness of left ankle, not elsewhere classified: Secondary | ICD-10-CM

## 2017-04-19 DIAGNOSIS — Z9889 Other specified postprocedural states: Secondary | ICD-10-CM

## 2017-04-19 DIAGNOSIS — M25572 Pain in left ankle and joints of left foot: Secondary | ICD-10-CM

## 2017-04-19 DIAGNOSIS — M6281 Muscle weakness (generalized): Secondary | ICD-10-CM

## 2017-04-19 NOTE — Therapy (Signed)
[facility] [address] [place], [place], [address] Phone: [phone]   Fax:  [phone]  Physical Therapy Treatment  Patient Details  Name: Gregory Watts MRN: [id number] Date of Birth: 11/18/1985 Referring Provider: Donnelly StagerM Xu, MD  Encounter Date: 04/19/2017      PT End of Session - 04/19/17 1419    Visit Number 10   Number of Visits 29   Date for PT Re-Evaluation 06/17/17   Authorization Type Self pay   PT Start Time 0215   PT Stop Time 0300   PT Time Calculation (min) 45 min   Activity Tolerance Patient tolerated treatment well;No increased pain   Behavior During Therapy [facility] for tasks assessed/performed      Past Medical History:  Diagnosis Date  . Achilles rupture, left 01/26/2017    Past Surgical History:  Procedure Laterality Date  . ACHILLES TENDON SURGERY Left 02/09/2017   Procedure: LEFT ACHILLES TENDON REPAIR;  Surgeon: Tarry KosXu, Naiping M, MD;  Location: [facility];  Service: Orthopedics;  Laterality: Left;  . NO PAST SURGERIES      There were no vitals filed for this visit.      Subjective Assessment - 04/19/17 1419    Subjective No complaints   Currently in Pain? No/denies                         [facility] PT Treatment/Exercise - 04/19/17 0001      Knee/Hip Exercises: Aerobic   Nustep L3 slow gentl ROM to tolerance  6 min, no pain     Manual Therapy   Joint Mobilization Anterior drawer glides in standing to improve DF Mobs with movement   Passive ROM PROM into DF also with great toe DF stretch     Ankle Exercises: Stretches   Gastroc Stretch 2 reps;60 seconds  standing     Ankle Exercises: Standing   SLS Lt with slide pillow case to side x15 Gregory Watts/back x12/forward x10 to tolerance  with hand hold L    Toe Raise 20 reps                  PT Short Term Goals - 04/12/17 1101      PT SHORT TERM GOAL #3   Title He will have 90 degrees active LT ankle DF    Status Achieved           PT Long Term Goals - 02/24/17 1537      PT LONG TERM GOAL #1   Title He iwll be independnet with all HEP issued   Time 16   Period Weeks   Status New     PT LONG TERM GOAL #2   Title He wil have 2-3 max pain with normal daily activity   Time 16   Period Weeks   Status New     PT LONG TERM GOAL #3   Title He will return to work full duty.    Time 16   Period Weeks   Status New     PT LONG TERM GOAL #4   Title He will have active Lt ankle ROM to equal RT to return to normal walking activity   Time 16   Period Weeks   Status New     PT LONG TERM GOAL #5   Title He wil begin to jog and cut if allowed by MD    Time 16   Period Weeks  Status New     PT LONG TERM GOAL #6   Title He will be able to walk up and down stairs step over step with one rail   Time 16   Period Weeks   Status New               Plan - 04/19/17 1420    Clinical Impression Statement Worked more on weight to Lt leg and balance today . More manual and stengthening next visit.    PT Treatment/Interventions Cryotherapy;Passive range of motion;Patient/family education;Stair training;Gait training;Therapeutic exercise;Therapeutic activities;Manual techniques   PT Next Visit Plan Advance to weeks 8- 10 of protocol.. , manual to increase DF progress strength without pain , gait without pain. balance   PT Home Exercise Plan 4 way SLR.   active DF/Pf / INVER/ EVER band ankle 3 way (no Pf)   gentle PROM DF and toewel exer, yellow band PF    Consulted and Agree with Plan of Care Patient      Patient will benefit from skilled therapeutic intervention in order to improve the following deficits and impairments:  Decreased range of motion, Difficulty walking, Pain, Increased edema, Decreased strength, Decreased activity tolerance  Visit Diagnosis: Status post Achilles tendon repair  Stiffness of left ankle, not elsewhere classified  Muscle weakness  (generalized)  Difficulty in walking, not elsewhere classified  Localized edema  Pain in left ankle and joints of left foot     Problem List Patient Active Problem List   Diagnosis Date Noted  . Achilles rupture, left 02/07/2017    Gregory Watts  PT 04/19/2017, 3:04 PM  [facility] [address] [place], [place], [address] Phone: [phone]   Fax:  [phone]  Name: Gregory Watts MRN: [id number] Date of Birth: 1986/02/01

## 2017-04-21 ENCOUNTER — Ambulatory Visit: Payer: Self-pay | Admitting: Physical Therapy

## 2017-04-21 ENCOUNTER — Encounter: Payer: Self-pay | Admitting: Physical Therapy

## 2017-04-21 DIAGNOSIS — R600 Localized edema: Secondary | ICD-10-CM

## 2017-04-21 DIAGNOSIS — Z9889 Other specified postprocedural states: Secondary | ICD-10-CM

## 2017-04-21 DIAGNOSIS — R262 Difficulty in walking, not elsewhere classified: Secondary | ICD-10-CM

## 2017-04-21 DIAGNOSIS — M25672 Stiffness of left ankle, not elsewhere classified: Secondary | ICD-10-CM

## 2017-04-21 DIAGNOSIS — M6281 Muscle weakness (generalized): Secondary | ICD-10-CM

## 2017-04-21 DIAGNOSIS — M25572 Pain in left ankle and joints of left foot: Secondary | ICD-10-CM

## 2017-04-21 NOTE — Patient Instructions (Signed)
Remove tape if irritating 

## 2017-04-21 NOTE — Therapy (Signed)
[facility] [address] [place], [place], [address] Phone: [phone]   Fax:  [phone]  Physical Therapy Treatment  Patient Details  Name: Gregory Watts MRN: [id number] Date of Birth: 09-09-1985 Referring Provider: Donnelly Stager, MD  Encounter Date: 04/21/2017      PT End of Session - 04/21/17 1508    Visit Number 11   Number of Visits 29   Date for PT Re-Evaluation 06/17/17   PT Start Time 1417   PT Stop Time 1500   PT Time Calculation (min) 43 min   Activity Tolerance Patient tolerated treatment well      Past Medical History:  Diagnosis Date  . Achilles rupture, left 01/26/2017    Past Surgical History:  Procedure Laterality Date  . ACHILLES TENDON SURGERY Left 02/09/2017   Procedure: LEFT ACHILLES TENDON REPAIR;  Surgeon: Tarry Kos, MD;  Location: [facility];  Service: Orthopedics;  Laterality: Left;  . NO PAST SURGERIES      There were no vitals filed for this visit.      Subjective Assessment - 04/21/17 1422    Subjective No pain.  Doing his exercises.  Hope to be able to wear a shoe after the next M appointment.    Currently in Pain? Yes   Pain Location Heel   Pain Orientation Left                         [facility] Adult PT Treatment/Exercise - 04/21/17 0001      Knee/Hip Exercises: Aerobic   Nustep L3 slow gentl ROM to tolerance  6 min, no pain     Moist Heat Therapy   Number Minutes Moist Heat --  concurrent with manual   Moist Heat Location --  posterior leg, moved around to different spots     Manual Therapy   Manual therapy comments soft tissue work softened congested tight leg/ scar   Joint Mobilization AP whole fibula, ankle joint A/P mobs with slight movement into DF   Soft tissue mobilization instrunent assist at times soft tissue work. , scar tissue mobs and scar taping for scar mobility.   Passive ROM PROM into DF, all toe stretch PA glides to mid foot.                   PT Education - 04/21/17 1508    Education provided Yes   Education Details Ankle anatomy,  what happens with DF   Person(s) Educated Patient   Methods Explanation   Comprehension Verbalized understanding          PT Short Term Goals - 04/12/17 1101      PT SHORT TERM GOAL #3   Title He will have 90 degrees active LT ankle DF   Status Achieved           PT Long Term Goals - 04/21/17 1511      PT LONG TERM GOAL #1   Title He iwll be independnet with all HEP issued   Baseline independent with exercises issued so far   Time 16   Period Weeks   Status On-going     PT LONG TERM GOAL #2   Title He wil have 2-3 max pain with normal daily activity   Baseline No pain with gait in boot   Time 16   Period Weeks   Status On-going     PT LONG TERM GOAL #3   Title  He will return to work full duty.    Baseline Not yet working   Time 16   Period Weeks   Status On-going     PT LONG TERM GOAL #4   Title He will have active Lt ankle ROM to equal RT to return to normal walking activity   Baseline Not yet equal.  5 degrees DF visually estimated  PROM   Time 16   Period Weeks   Status On-going     PT LONG TERM GOAL #5   Title He wil begin to jog and cut if allowed by MD    Baseline Not allowed   Time 16   Period Weeks   Status Unable to assess     PT LONG TERM GOAL #6   Title He will be able to walk up and down stairs step over step with one rail   Time 16   Period Weeks   Status Unable to assess               Plan - 04/21/17 1509    Clinical Impression Statement Manual focus other than Nu step.  Trial scar tape.  No new objective findings,  Great toe rigid with extension RT>LT.   PT Treatment/Interventions Cryotherapy;Passive range of motion;Patient/family education;Stair training;Gait training;Therapeutic exercise;Therapeutic activities;Manual techniques   PT Next Visit Plan Advance to weeks 8- 10 of protocol.. , manual to increase DF  progress strength without pain , gait without pain. balance   PT Home Exercise Plan 4 way SLR.   active DF/Pf / INVER/ EVER band ankle 3 way (no Pf)   gentle PROM DF and toewel exer, yellow band PF    Consulted and Agree with Plan of Care Patient      Patient will benefit from skilled therapeutic intervention in order to improve the following deficits and impairments:     Visit Diagnosis: Status post Achilles tendon repair  Stiffness of left ankle, not elsewhere classified  Muscle weakness (generalized)  Difficulty in walking, not elsewhere classified  Localized edema  Pain in left ankle and joints of left foot     Problem List Patient Active Problem List   Diagnosis Date Noted  . Achilles rupture, left 02/07/2017    HARRIS,KAREN PTA 04/21/2017, 3:14 PM  [facility] [address] [place], [place], [address] Phone: [phone]   Fax:  [phone]  Name: Gregory Watts MRN: [id number] Date of Birth: 11/06/1985

## 2017-04-25 ENCOUNTER — Encounter: Payer: Self-pay | Admitting: Physical Therapy

## 2017-04-25 ENCOUNTER — Ambulatory Visit: Payer: Self-pay | Admitting: Physical Therapy

## 2017-04-25 DIAGNOSIS — M6281 Muscle weakness (generalized): Secondary | ICD-10-CM

## 2017-04-25 DIAGNOSIS — R600 Localized edema: Secondary | ICD-10-CM

## 2017-04-25 DIAGNOSIS — M25672 Stiffness of left ankle, not elsewhere classified: Secondary | ICD-10-CM

## 2017-04-25 DIAGNOSIS — M25572 Pain in left ankle and joints of left foot: Secondary | ICD-10-CM

## 2017-04-25 DIAGNOSIS — R262 Difficulty in walking, not elsewhere classified: Secondary | ICD-10-CM

## 2017-04-25 DIAGNOSIS — Z9889 Other specified postprocedural states: Secondary | ICD-10-CM

## 2017-04-25 NOTE — Therapy (Signed)
[place], [place], [address] Phone: [phone]   Fax:  [phone]  Physical Therapy Treatment  Patient Details  Name: Gregory Watts MRN: [id number] Date of Birth: [birth year] Referring Provider: Ephriam Jenkins, MD  Encounter Date: 04/25/2017      PT End of Session - 04/25/17 1810    Visit Number 12   Number of Visits 29   Date for PT Re-Evaluation 06/17/17   PT Start Time 0177   PT Stop Time 1502   PT Time Calculation (min) 45 min   Activity Tolerance Patient tolerated treatment well   Behavior During Therapy [facility] for tasks assessed/performed      Past Medical History:  Diagnosis Date  . Achilles rupture, left 01/26/2017    Past Surgical History:  Procedure Laterality Date  . ACHILLES TENDON SURGERY Left 02/09/2017   Procedure: LEFT ACHILLES TENDON REPAIR;  Surgeon: Leandrew Koyanagi, MD;  Location: [place];  Service: Orthopedics;  Laterality: Left;  . NO PAST SURGERIES      There were no vitals filed for this visit.      Subjective Assessment - 04/25/17 1421    Subjective No pain.  The tape did well. Wearing the boot today   Currently in Pain? No/denies   Pain Score 0-No pain                         [facility] Adult PT Treatment/Exercise - 04/25/17 0001      Knee/Hip Exercises: Aerobic   Nustep L3 slow gentl ROM to tolerance  6 min, no pain     Knee/Hip Exercises: Standing   Terminal Knee Extension Limitations 2 sets of 10 X 2 positions min assist to decrease compension green band   SLS 1 second best   Gait Training pre gait wall slide facing wall foot moved for monor gentle stretch 10 X left forward.       Moist Heat Therapy   Moist Heat Location --  calf, concurrent with manual     Manual Therapy   Manual therapy comments joint mibilization with movement in weightbwarng (Gastroc stretch 10 x 2 for gentle stretch.   Soft tissue mobilization scar taping with  middle scar focus. scar tissue manual mobilization with movement standing at anlke joint.       Ankle Exercises: Supine   Isometrics 10 X 5 seconds IV   Other Supine Ankle Exercises Green band PF/ EV/ DF 10 X each                  PT Short Term Goals - 04/12/17 1101      PT SHORT TERM GOAL #3   Title He will have 90 degrees active LT ankle DF   Status Achieved           PT Long Term Goals - 04/25/17 1814      PT LONG TERM GOAL #1   Title He will be independnet with all HEP issued   Baseline independent with exercises issued so far   Status On-going     PT LONG TERM GOAL #2   Title He wil have 2-3 max pain with normal daily activity   Baseline No pain with gait in boot   Time 16   Period Weeks   Status Partially Met     PT LONG TERM GOAL #3   Title He will return to work full duty.    Baseline  Not yet working   Time 16   Period Weeks   Status On-going     PT LONG TERM GOAL #4   Title He will have active Lt ankle ROM to equal RT to return to normal walking activity   Baseline Not yet equal.   more than 5 degrees DF visually estimated  with knee flexed   Time 16   Period Weeks   Status On-going     PT LONG TERM GOAL #5   Title He wil begin to jog and cut if allowed by MD    Time 16   Period Weeks   Status Unable to assess     PT LONG TERM GOAL #6   Title He will be able to walk up and down stairs step over step with one rail   Time 16   Period Weeks   Status Unable to assess               Plan - 04/25/17 1810    Clinical Impression Statement Less stiff post session.  Mobilization with movement shows DF visually more than 5 degrees.  He continues to wear a boot to PT so he is limited to some exercises. exercise he could be doing with a shoe . LTG#2 partially met.  Taping mildly helpful with the scar mobility.   PT Next Visit Plan Advance to weeks 8- 10 of protocol.. , manual to increase DF progress strength without pain , gait without pain.  balance   PT Home Exercise Plan 4 way SLR.   active DF/Pf / INVER/ EVER band ankle 3 way (no Pf)   gentle PROM DF and toewel exer, yellow band PF    Consulted and Agree with Plan of Care Patient      Patient will benefit from skilled therapeutic intervention in order to improve the following deficits and impairments:  Decreased range of motion, Difficulty walking, Pain, Increased edema, Decreased strength, Decreased activity tolerance  Visit Diagnosis: Status post Achilles tendon repair  Stiffness of left ankle, not elsewhere classified  Muscle weakness (generalized)  Difficulty in walking, not elsewhere classified  Localized edema  Pain in left ankle and joints of left foot     Problem List Patient Active Problem List   Diagnosis Date Noted  . Achilles rupture, left 02/07/2017    Gregory Watts PTA 04/25/2017, 6:17 PM  [facility] [address] [place], [place], [address] Phone: [phone]   Fax:  [phone]  Name: Gregory Watts MRN: [id number] Date of Birth: [birth year]

## 2017-04-28 ENCOUNTER — Ambulatory Visit: Payer: Self-pay | Admitting: Physical Therapy

## 2017-04-28 DIAGNOSIS — R262 Difficulty in walking, not elsewhere classified: Secondary | ICD-10-CM

## 2017-04-28 DIAGNOSIS — M25672 Stiffness of left ankle, not elsewhere classified: Secondary | ICD-10-CM

## 2017-04-28 DIAGNOSIS — M25572 Pain in left ankle and joints of left foot: Secondary | ICD-10-CM

## 2017-04-28 DIAGNOSIS — R600 Localized edema: Secondary | ICD-10-CM

## 2017-04-28 DIAGNOSIS — M6281 Muscle weakness (generalized): Secondary | ICD-10-CM

## 2017-04-28 NOTE — Therapy (Signed)
[place], [place], [address] Phone: [phone]   Fax:  [phone]  Physical Therapy Treatment  Patient Details  Name: Gregory Watts MRN: [id number] Date of Birth: 1986/08/05 Referring Provider: Ephriam Jenkins, MD  Encounter Date: 04/28/2017      PT End of Session - 04/28/17 1451    Visit Number 13   Number of Visits 29   Date for PT Re-Evaluation 06/17/17   Authorization Type Self pay   PT Start Time 1415   PT Stop Time 1455   PT Time Calculation (min) 40 min   Activity Tolerance Patient tolerated treatment well   Behavior During Therapy [facility] for tasks assessed/performed      Past Medical History:  Diagnosis Date  . Achilles rupture, left 01/26/2017    Past Surgical History:  Procedure Laterality Date  . ACHILLES TENDON SURGERY Left 02/09/2017   Procedure: LEFT ACHILLES TENDON REPAIR;  Surgeon: Leandrew Koyanagi, MD;  Location: [place];  Service: Orthopedics;  Laterality: Left;  . NO PAST SURGERIES      There were no vitals filed for this visit.      Subjective Assessment - 04/28/17 1419    Subjective no pain, doing well overall   Patient Stated Goals Get heel back right.  He wants to be ale to walk  without device,  jog /run,  return to work and leisure activity   Currently in Pain? Yes   Pain Score 0-No pain            [facility] PT Assessment - 04/28/17 1423      Assessment   Medical Diagnosis LT achilles tendon repair   Referring Provider Ephriam Jenkins, MD   Onset Date/Surgical Date 02/09/17   Next MD Visit 05/02/17     AROM   Left Ankle Dorsiflexion 5  past neutral in standing   Left Ankle Plantar Flexion 48   Left Ankle Inversion 26   Left Ankle Eversion 19     PROM   Overall PROM Comments Lt ankle PF 56                     [facility] Adult PT Treatment/Exercise - 04/28/17 1419      Knee/Hip Exercises: Aerobic   Nustep L3 slow gentl ROM to tolerance  8 min, no  pain     Ankle Exercises: Stretches   Soleus Stretch 3 reps;30 seconds   Gastroc Stretch 3 reps;30 seconds     Ankle Exercises: Supine   T-Band 4 way with green tband x 20 reps     Ankle Exercises: Seated   BAPS Level 2;Sitting  20 reps; DF/PF, INV/EV, CW/CCW                  PT Short Term Goals - 04/28/17 1452      PT SHORT TERM GOAL #1   Title He will be independent with inital HEP   Status Achieved     PT SHORT TERM GOAL #2   Title He will report no increased pain with exercises   Status Achieved     PT SHORT TERM GOAL #3   Title He will have 90 degrees active LT ankle DF   Status Achieved           PT Long Term Goals - 04/28/17 1452      PT LONG TERM GOAL #1   Title He will be independnet with all HEP issued  Baseline independent with exercises issued so far   Status On-going     PT LONG TERM GOAL #2   Title He wil have 2-3 max pain with normal daily activity   Baseline No pain with gait in boot   Status Partially Met     PT LONG TERM GOAL #3   Title He will return to work full duty.    Baseline 04/28/17: returned yesterday at light duty   Status On-going     PT LONG TERM GOAL #4   Title He will have active Lt ankle ROM to equal RT to return to normal walking activity   Baseline Not yet equal.   more than 5 degrees DF visually estimated  with knee flexed   Status On-going     PT LONG TERM GOAL #5   Title He wil begin to jog and cut if allowed by MD    Status Unable to assess     PT LONG TERM GOAL #6   Title He will be able to walk up and down stairs step over step with one rail   Baseline 04/28/17: wore boot to PT, will assess when he wears a shoe               Plan - 04/28/17 1455    Clinical Impression Statement Pt continuing to progress well with PT and ROM improved in all motions to date.  Hopeful to be able to progress standing exercises with pt out of boot following MD appt next week.   PT Treatment/Interventions  Cryotherapy;Passive range of motion;Patient/family education;Stair training;Gait training;Therapeutic exercise;Therapeutic activities;Manual techniques   PT Next Visit Plan Advance to weeks 8- 10 of protocol.. , manual to increase DF progress strength without pain , gait without pain. balance; f/u on MD visit   Consulted and Agree with Plan of Care Patient      Patient will benefit from skilled therapeutic intervention in order to improve the following deficits and impairments:  Decreased range of motion, Difficulty walking, Pain, Increased edema, Decreased strength, Decreased activity tolerance  Visit Diagnosis: Stiffness of left ankle, not elsewhere classified  Muscle weakness (generalized)  Difficulty in walking, not elsewhere classified  Localized edema  Pain in left ankle and joints of left foot     Problem List Patient Active Problem List   Diagnosis Date Noted  . Achilles rupture, left 02/07/2017      Laureen Abrahams, PT, DPT 04/28/17 2:57 PM    [facility] [address] [place], [place], [address] Phone: [phone]   Fax:  [phone]  Name: Michelle Vanhise MRN: [id number] Date of Birth: 1986-05-11

## 2017-05-02 ENCOUNTER — Encounter (INDEPENDENT_AMBULATORY_CARE_PROVIDER_SITE_OTHER): Payer: Self-pay | Admitting: Orthopaedic Surgery

## 2017-05-02 ENCOUNTER — Ambulatory Visit (INDEPENDENT_AMBULATORY_CARE_PROVIDER_SITE_OTHER): Payer: Self-pay | Admitting: Orthopaedic Surgery

## 2017-05-02 ENCOUNTER — Ambulatory Visit: Payer: Self-pay | Admitting: Physical Therapy

## 2017-05-02 DIAGNOSIS — Z9889 Other specified postprocedural states: Secondary | ICD-10-CM

## 2017-05-02 DIAGNOSIS — M25672 Stiffness of left ankle, not elsewhere classified: Secondary | ICD-10-CM

## 2017-05-02 DIAGNOSIS — R262 Difficulty in walking, not elsewhere classified: Secondary | ICD-10-CM

## 2017-05-02 DIAGNOSIS — M6281 Muscle weakness (generalized): Secondary | ICD-10-CM

## 2017-05-02 DIAGNOSIS — M25572 Pain in left ankle and joints of left foot: Secondary | ICD-10-CM

## 2017-05-02 DIAGNOSIS — S86012A Strain of left Achilles tendon, initial encounter: Secondary | ICD-10-CM

## 2017-05-02 DIAGNOSIS — R600 Localized edema: Secondary | ICD-10-CM

## 2017-05-02 NOTE — Therapy (Signed)
[place], [place], [address] Phone: [phone]   Fax:  [phone]  Physical Therapy Treatment  Patient Details  Name: Gregory Watts MRN: [id number] Date of Birth: 1985-12-08 Referring Provider: Ephriam Jenkins, MD  Encounter Date: 05/02/2017      PT End of Session - 05/02/17 1459    Visit Number 14   Number of Visits 29   Date for PT Re-Evaluation 06/17/17   Authorization Type Self pay   PT Start Time 1415   PT Stop Time 1500   PT Time Calculation (min) 45 min   Activity Tolerance Patient tolerated treatment well   Behavior During Therapy [facility] for tasks assessed/performed      Past Medical History:  Diagnosis Date  . Achilles rupture, left 01/26/2017    Past Surgical History:  Procedure Laterality Date  . ACHILLES TENDON SURGERY Left 02/09/2017   Procedure: LEFT ACHILLES TENDON REPAIR;  Surgeon: Leandrew Koyanagi, MD;  Location: [place];  Service: Orthopedics;  Laterality: Left;  . NO PAST SURGERIES      There were no vitals filed for this visit.      Subjective Assessment - 05/02/17 1421    Subjective The MD was pleased.  Still weak. Has been wearing the shoe 3 weeks.   Cut back to 1 x per week.  No pain.     Currently in Pain? No/denies               [facility] Adult PT Treatment/Exercise - 05/02/17 0001      Self-Care   Self-Care Scar Mobilizations;Other Self-Care Comments   Scar Mobilizations self massage to foot, ankle, scar   Other Self-Care Comments  stretch off step gentle      Manual Therapy   Soft tissue mobilization post calf, Achilles   Passive ROM PROM into DF, all toe stretch PA glides to mid foot.       Ankle Exercises: Aerobic   Stationary Bike 5 min level 3 for warm up      Ankle Exercises: Stretches   Gastroc Stretch 5 reps;30 seconds  off step    Slant Board Stretch 4 reps;30 seconds  lacks hip extension    Other Stretch knees bent x 2 knees ext x 2       Ankle Exercises: Seated   Other Seated Ankle Exercises heel raises x 20      Ankle Exercises: Standing   Vector Stance --  semi circle towel on floor with LLE supporting    SLS abduction, extension and flexion on Airex with 1 HHA    Heel Raises 10 reps;5 seconds   Toe Raise 10 reps   Heel Walk (Round Trip) 3 sets in various positions    Other Standing Ankle Exercises arch lifts x 10                   PT Short Term Goals - 04/28/17 1452      PT SHORT TERM GOAL #1   Title He will be independent with inital HEP   Status Achieved     PT SHORT TERM GOAL #2   Title He will report no increased pain with exercises   Status Achieved     PT SHORT TERM GOAL #3   Title He will have 90 degrees active LT ankle DF   Status Achieved           PT Long Term Goals - 04/28/17 1452  PT LONG TERM GOAL #1   Title He will be independnet with all HEP issued   Baseline independent with exercises issued so far   Status On-going     PT LONG TERM GOAL #2   Title He wil have 2-3 max pain with normal daily activity   Baseline No pain with gait in boot   Status Partially Met     PT LONG TERM GOAL #3   Title He will return to work full duty.    Baseline 04/28/17: returned yesterday at light duty   Status On-going     PT [place] #4   Title He will have active Lt ankle ROM to equal RT to return to normal walking activity   Baseline Not yet equal.   more than 5 degrees DF visually estimated  with knee flexed   Status On-going     PT LONG TERM GOAL #5   Title He wil begin to jog and cut if allowed by MD    Status Unable to assess     PT LONG TERM GOAL #6   Title He will be able to walk up and down stairs step over step with one rail   Baseline 04/28/17: wore boot to PT, will assess when he wears a shoe               Plan - 05/02/17 1503    Clinical Impression Statement Pt with very tight scar tissue along incision. Worked in standing, closed chain without  increased pain.  He has a significant limp and walks with minimal knee flexion , wide BOS.  MD said he could go down to 1 x 6 weeks after next.     PT Next Visit Plan Gait, renew 1 x 6 weeks , Advance to weeks 8- 10 of protocol.. , manual to increase DF progress strength without pain , gait without pain. balance; f/u on MD visit   PT Home Exercise Plan 4 way SLR.   active DF/Pf / INVER/ EVER band ankle 3 way (no Pf)   gentle PROM DF and toewel exer, yellow band PF    Consulted and Agree with Plan of Care Patient      Patient will benefit from skilled therapeutic intervention in order to improve the following deficits and impairments:  Decreased range of motion, Difficulty walking, Pain, Increased edema, Decreased strength, Decreased activity tolerance  Visit Diagnosis: Muscle weakness (generalized)  Stiffness of left ankle, not elsewhere classified  Difficulty in walking, not elsewhere classified  Localized edema  Status post Achilles tendon repair  Pain in left ankle and joints of left foot     Problem List Patient Active Problem List   Diagnosis Date Noted  . Achilles rupture, left 02/07/2017    PAA,JENNIFER 05/02/2017, 3:10 PM  [facility] [address] [place], [place], [address] Phone: [phone]   Fax:  [phone]  Name: Gregory Watts MRN: [id number] Date of Birth: 1986-05-10   Raeford Razor, PT 05/02/17 3:10 PM Phone: [phone] Fax: [phone]

## 2017-05-02 NOTE — Progress Notes (Signed)
Patient is almost 3 months status post left Achilles repair. He is overall doing well. He is doing physical therapy twice a week. He does have a mild residual limp. His surgical scar is fully healed. He does have limited ankle dorsiflexion. Strength is decreased as expected. From my standpoint patient is doing well and progressing appropriately. He may drive back to physical therapy once a week for the next 6 weeks. Continue home exercises. Questions encouraged and answered. Follow-up with me as needed. He understands full recovery will take up to a year.

## 2017-05-04 ENCOUNTER — Encounter: Payer: Self-pay | Admitting: Physical Therapy

## 2017-05-04 ENCOUNTER — Ambulatory Visit: Payer: Self-pay | Admitting: Physical Therapy

## 2017-05-04 DIAGNOSIS — M25672 Stiffness of left ankle, not elsewhere classified: Secondary | ICD-10-CM

## 2017-05-04 DIAGNOSIS — M6281 Muscle weakness (generalized): Secondary | ICD-10-CM

## 2017-05-04 DIAGNOSIS — M25572 Pain in left ankle and joints of left foot: Secondary | ICD-10-CM

## 2017-05-04 DIAGNOSIS — R600 Localized edema: Secondary | ICD-10-CM

## 2017-05-04 DIAGNOSIS — R262 Difficulty in walking, not elsewhere classified: Secondary | ICD-10-CM

## 2017-05-04 DIAGNOSIS — Z9889 Other specified postprocedural states: Secondary | ICD-10-CM

## 2017-05-04 NOTE — Therapy (Signed)
[place], [place], [address] Phone: [phone]   Fax:  [phone]  Physical Therapy Treatment  Patient Details  Name: Gregory Watts MRN: [id number] Date of Birth: 1985-09-12 Referring Provider: Ephriam Jenkins, MD  Encounter Date: 05/04/2017      PT End of Session - 05/04/17 1501    Visit Number 15   Number of Visits 29   Date for PT Re-Evaluation 06/17/17   PT Start Time 1751   PT Stop Time 1501   PT Time Calculation (min) 46 min   Activity Tolerance Patient tolerated treatment well   Behavior During Therapy [facility] for tasks assessed/performed      Past Medical History:  Diagnosis Date  . Achilles rupture, left 01/26/2017    Past Surgical History:  Procedure Laterality Date  . ACHILLES TENDON SURGERY Left 02/09/2017   Procedure: LEFT ACHILLES TENDON REPAIR;  Surgeon: Leandrew Koyanagi, MD;  Location: [place];  Service: Orthopedics;  Laterality: Left;  . NO PAST SURGERIES      There were no vitals filed for this visit.      Subjective Assessment - 05/04/17 1418    Subjective "things are going fine, just tight in the back"    Currently in Pain? No/denies   Pain Score 0-No pain   Pain Frequency Intermittent   Aggravating Factors  N/A   Pain Relieving Factors N/A            [facility] PT Assessment - 05/04/17 0001      AROM   Left Ankle Dorsiflexion 6   Left Ankle Plantar Flexion 48                     [facility] Adult PT Treatment/Exercise - 05/04/17 0001      Manual Therapy   Joint Mobilization grade 5 talocrural distraction, AP PA grade 4 mobs  mobs with movement grade 3 with foot on step   Soft tissue mobilization IASTM over calf and achilles in prone with sustained calf stretch     Ankle Exercises: Standing   Heel Raises 10 reps  1 set going into fatigue,2 x 10 with posterior tib activatio   Other Standing Ankle Exercises mobs with movement lunge position  rocking forward with band around back of ankle and around front of ankle 3 x 15 with blue theraband     Ankle Exercises: Stretches   Gastroc Stretch 4 reps;30 seconds  off edge of step                PT Education - 05/04/17 1448    Education provided Yes   Education Details updated HEP for mobs with movement   Person(s) Educated Patient   Methods Explanation;Verbal cues   Comprehension Verbalized understanding;Verbal cues required          PT Short Term Goals - 04/28/17 1452      PT SHORT TERM GOAL #1   Title He will be independent with inital HEP   Status Achieved     PT SHORT TERM GOAL #2   Title He will report no increased pain with exercises   Status Achieved     PT SHORT TERM GOAL #3   Title He will have 90 degrees active LT ankle DF   Status Achieved           PT Long Term Goals - 04/28/17 1452      PT LONG TERM GOAL #1   Title  He will be independnet with all HEP issued   Baseline independent with exercises issued so far   Status On-going     PT LONG TERM GOAL #2   Title He wil have 2-3 max pain with normal daily activity   Baseline No pain with gait in boot   Status Partially Met     PT LONG TERM GOAL #3   Title He will return to work full duty.    Baseline 04/28/17: returned yesterday at light duty   Status On-going     PT [place] #4   Title He will have active Lt ankle ROM to equal RT to return to normal walking activity   Baseline Not yet equal.   more than 5 degrees DF visually estimated  with knee flexed   Status On-going     PT LONG TERM GOAL #5   Title He wil begin to jog and cut if allowed by MD    Status Unable to assess     PT LONG TERM GOAL #6   Title He will be able to walk up and down stairs step over step with one rail   Baseline 04/28/17: wore boot to PT, will assess when he wears a shoe               Plan - 05/04/17 1501    Clinical Impression Statement pt continues to demo tight heel cords especially  alont incision and achilles. peformed soft tissue work with pt on stretch and mobs / mobs iwth movement. pt reported improvement of mobility stating it felt easier to move. He declined modalities.    PT Treatment/Interventions Cryotherapy;Passive range of motion;Patient/family education;Stair training;Gait training;Therapeutic exercise;Therapeutic activities;Manual techniques   PT Next Visit Plan Gait, renew 1 x 6 weeks , Advance to weeks 8- 10 of protocol.. , manual to increase DF progress strength without pain , gait without pain. balance; f/u on MD visit      Patient will benefit from skilled therapeutic intervention in order to improve the following deficits and impairments:  Decreased range of motion, Difficulty walking, Pain, Increased edema, Decreased strength, Decreased activity tolerance  Visit Diagnosis: Muscle weakness (generalized)  Stiffness of left ankle, not elsewhere classified  Difficulty in walking, not elsewhere classified  Localized edema  Status post Achilles tendon repair  Pain in left ankle and joints of left foot     Problem List Patient Active Problem List   Diagnosis Date Noted  . Achilles rupture, left 02/07/2017   Starr Lake PT, DPT, LAT, ATC  05/04/17  3:04 PM      [facility] [address] [place], [place], [address] Phone: [phone]   Fax:  [phone]  Name: Gregory Watts MRN: [id number] Date of Birth: [birth year]

## 2017-05-10 ENCOUNTER — Ambulatory Visit: Payer: Self-pay | Attending: Orthopaedic Surgery | Admitting: Physical Therapy

## 2017-05-10 ENCOUNTER — Encounter: Payer: Self-pay | Admitting: Physical Therapy

## 2017-05-10 DIAGNOSIS — M25572 Pain in left ankle and joints of left foot: Secondary | ICD-10-CM | POA: Insufficient documentation

## 2017-05-10 DIAGNOSIS — M6281 Muscle weakness (generalized): Secondary | ICD-10-CM | POA: Insufficient documentation

## 2017-05-10 DIAGNOSIS — R600 Localized edema: Secondary | ICD-10-CM | POA: Insufficient documentation

## 2017-05-10 DIAGNOSIS — R262 Difficulty in walking, not elsewhere classified: Secondary | ICD-10-CM | POA: Insufficient documentation

## 2017-05-10 DIAGNOSIS — Z9889 Other specified postprocedural states: Secondary | ICD-10-CM | POA: Insufficient documentation

## 2017-05-10 DIAGNOSIS — M25672 Stiffness of left ankle, not elsewhere classified: Secondary | ICD-10-CM | POA: Insufficient documentation

## 2017-05-10 NOTE — Therapy (Signed)
[place], [place], [address] Phone: [phone]   Fax:  [phone]  Physical Therapy Treatment  Patient Details  Name: Gregory Watts MRN: [id number] Date of Birth: December 24, 1985 Referring Provider: Ephriam Jenkins, MD  Encounter Date: 05/10/2017      PT End of Session - 05/10/17 1424    Visit Number 16   Number of Visits 29   Date for PT Re-Evaluation 06/17/17   Authorization Type Self pay   PT Start Time 1425  pt arrived late   PT Stop Time 1456   PT Time Calculation (min) 31 min   Activity Tolerance Patient tolerated treatment well   Behavior During Therapy [name] for tasks assessed/performed      Past Medical History:  Diagnosis Date  . Achilles rupture, left 01/26/2017    Past Surgical History:  Procedure Laterality Date  . ACHILLES TENDON SURGERY Left 02/09/2017   Procedure: LEFT ACHILLES TENDON REPAIR;  Surgeon: Leandrew Koyanagi, MD;  Location: [place];  Service: Orthopedics;  Laterality: Left;  . NO PAST SURGERIES      There were no vitals filed for this visit.      Subjective Assessment - 05/10/17 1425    Subjective Has gone back to work and is walking all of the time so feels like it has loosened up. Has swollen recently.    Patient Stated Goals Get heel back right.  He wants to be ale to walk  without device,  jog /run,  return to work and leisure activity   Currently in Pain? No/denies   Pain Location Heel  achilles   Pain Orientation Left            [facility] PT Assessment - 05/10/17 0001      Observation/Other Assessments   Focus on Therapeutic Outcomes (FOTO)  39% limited     AROM   Left Ankle Dorsiflexion 4   Left Ankle Plantar Flexion 52   Left Ankle Inversion 48   Left Ankle Eversion 10                     [facility] Adult PT Treatment/Exercise - 05/10/17 0001      Knee/Hip Exercises: Aerobic   Stationary Bike 5 min L4     Knee/Hip Exercises:  Standing   SLS on airex   Other Standing Knee Exercises eccentric step down     Manual Therapy   Soft tissue mobilization IASTM & DF stretching                PT Education - 05/10/17 1501    Education provided Yes   Education Details exercise form/rationale, manual rationale, HEP, POC, FOTO & goals discussion   Person(s) Educated Patient   Methods Explanation;Demonstration;Tactile cues;Verbal cues   Comprehension Verbalized understanding;Returned demonstration;Verbal cues required;Tactile cues required;Need further instruction          PT Short Term Goals - 04/28/17 1452      PT SHORT TERM GOAL #1   Title He will be independent with inital HEP   Status Achieved     PT SHORT TERM GOAL #2   Title He will report no increased pain with exercises   Status Achieved     PT SHORT TERM GOAL #3   Title He will have 90 degrees active LT ankle DF   Status Achieved           PT Long Term Goals - 05/10/17 1442  PT LONG TERM GOAL #1   Title He will be independnet with all HEP issued   Baseline independent with exercises issued so far   Status On-going     PT LONG TERM GOAL #2   Title He wil have 2-3 max pain with normal daily activity   Baseline no pain at all   Status Achieved     PT LONG TERM GOAL #3   Title He will return to work full duty.    Baseline light duty   Status Partially Met     PT LONG TERM GOAL #4   Title He will have active Lt ankle ROM to equal RT to return to normal walking activity   Baseline L 4 R 10     PT LONG TERM GOAL #5   Title He wil begin to jog and cut if allowed by MD    Baseline reports MD has said he is allowed but he does not think he is ready   Status On-going     PT LONG TERM GOAL #6   Title He will be able to walk up and down stairs step over step with one rail   Baseline can do it regularly, forgets coming down   Status Partially Met               Plan - 05/10/17 1457    Clinical Impression Statement Pt  demo tightness into DF but is able to maintain proper alignment of LE. Denies pain but still feels tightness. Wants to progress to running and jumping. POC to 1/week and I told pt that if he/we feel that is not appropraite, it can be altered.    PT Frequency 1x / week   PT Duration 6 weeks   PT Treatment/Interventions Cryotherapy;Passive range of motion;Patient/family education;Stair training;Gait training;Therapeutic exercise;Therapeutic activities;Manual techniques   PT Next Visit Plan DF ROM, progress to plyo-reformer?, balance, gait pattern, all without pain   PT Home Exercise Plan 4 way SLR.   active DF/Pf / INVER/ EVER band ankle 3 way (no Pf)   gentle PROM DF and toewel exer, yellow band PF; SLS   Consulted and Agree with Plan of Care Patient      Patient will benefit from skilled therapeutic intervention in order to improve the following deficits and impairments:  Decreased range of motion, Difficulty walking, Pain, Increased edema, Decreased strength, Decreased activity tolerance  Visit Diagnosis: Muscle weakness (generalized)  Stiffness of left ankle, not elsewhere classified  Difficulty in walking, not elsewhere classified  Localized edema     Problem List Patient Active Problem List   Diagnosis Date Noted  . Achilles rupture, left 02/07/2017    Robynn Marcel C. Edwards Mckelvie PT, DPT 05/10/17 3:02 PM   [facility] [address] [place], [place], [address] Phone: [phone]   Fax:  [phone]  Name: Gregory Watts MRN: [id number] Date of Birth: [birth year]

## 2017-05-12 ENCOUNTER — Ambulatory Visit: Payer: Self-pay | Admitting: Physical Therapy

## 2017-05-12 ENCOUNTER — Encounter: Payer: Self-pay | Admitting: Physical Therapy

## 2017-05-12 DIAGNOSIS — M25572 Pain in left ankle and joints of left foot: Secondary | ICD-10-CM

## 2017-05-12 DIAGNOSIS — M25672 Stiffness of left ankle, not elsewhere classified: Secondary | ICD-10-CM

## 2017-05-12 DIAGNOSIS — R262 Difficulty in walking, not elsewhere classified: Secondary | ICD-10-CM

## 2017-05-12 DIAGNOSIS — R600 Localized edema: Secondary | ICD-10-CM

## 2017-05-12 DIAGNOSIS — M6281 Muscle weakness (generalized): Secondary | ICD-10-CM

## 2017-05-12 DIAGNOSIS — Z9889 Other specified postprocedural states: Secondary | ICD-10-CM

## 2017-05-12 NOTE — Patient Instructions (Signed)
Tie shoes tight to support feet.

## 2017-05-12 NOTE — Therapy (Signed)
[place], [place], [address] Phone: [phone]   Fax:  [phone]  Physical Therapy Treatment  Patient Details  Name: Gregory Watts MRN: [id number] Date of Birth: 03-12-1986 Referring Provider: Ephriam Jenkins, MD  Encounter Date: 05/12/2017      PT End of Session - 05/12/17 1508    Visit Number 17   Number of Visits 29   Date for PT Re-Evaluation 06/17/17   PT Start Time 1416   PT Stop Time 1500   PT Time Calculation (min) 44 min   Activity Tolerance Patient tolerated treatment well   Behavior During Therapy [facility] for tasks assessed/performed      Past Medical History:  Diagnosis Date  . Achilles rupture, left 01/26/2017    Past Surgical History:  Procedure Laterality Date  . ACHILLES TENDON SURGERY Left 02/09/2017   Procedure: LEFT ACHILLES TENDON REPAIR;  Surgeon: Leandrew Koyanagi, MD;  Location: [place];  Service: Orthopedics;  Laterality: Left;  . NO PAST SURGERIES      There were no vitals filed for this visit.      Subjective Assessment - 05/12/17 1503    Subjective No pain.  i have tried a little jogging inside just to see how it did.     Currently in Pain? No/denies   Pain Location Heel   Pain Orientation Left                         [facility] Adult PT Treatment/Exercise - 05/12/17 0001      High Level Balance   High Level Balance Comments Airex, and on floor, single, tandem, rhomberg static, dynamic, head/ arm/ movemnets  close SBA.  needs to use hand for balance. multiple reps.     Self-Care   Self-Care Scar Mobilizations;Other Self-Care Comments   Scar Mobilizations taping kinesiotex for scar mobs.      Knee/Hip Exercises: Stretches   Gastroc Stretch 2 reps;30 seconds   Gastroc Stretch Limitations slant board     Knee/Hip Exercises: Aerobic   Elliptical 6 minutes, ramp 1, last 3 seconds increased speed to simulate slow jog.      Knee/Hip Exercises:  Standing   Other Standing Knee Exercises jogging 4 X 30 seconds on trampoline     Moist Heat Therapy   Number Minutes Moist Heat --  10, concurrent with manual gastroc.     Manual Therapy   Manual therapy comments scat tissue manual and taping, PROM,                  PT Short Term Goals - 04/28/17 1452      PT SHORT TERM GOAL #1   Title He will be independent with inital HEP   Status Achieved     PT SHORT TERM GOAL #2   Title He will report no increased pain with exercises   Status Achieved     PT SHORT TERM GOAL #3   Title He will have 90 degrees active LT ankle DF   Status Achieved           PT Long Term Goals - 05/10/17 1442      PT LONG TERM GOAL #1   Title He will be independnet with all HEP issued   Baseline independent with exercises issued so far   Status On-going     PT LONG TERM GOAL #2   Title He wil have 2-3 max pain with  normal daily activity   Baseline no pain at all   Status Achieved     PT LONG TERM GOAL #3   Title He will return to work full duty.    Baseline light duty   Status Partially Met     PT LONG TERM GOAL #4   Title He will have active Lt ankle ROM to equal RT to return to normal walking activity   Baseline L 4 R 10     PT LONG TERM GOAL #5   Title He wil begin to jog and cut if allowed by MD    Baseline reports MD has said he is allowed but he does not think he is ready   Status On-going     PT LONG TERM GOAL #6   Title He will be able to walk up and down stairs step over step with one rail   Baseline can do it regularly, forgets coming down   Status Partially Met               Plan - 05/12/17 1508    Clinical Impression Statement SLS 16 seconds, best left.  Balance focus today. Patient able to simulate jogging/ running on elliptical and on trampoline.  No pain increased with session.     PT Treatment/Interventions Cryotherapy;Passive range of motion;Patient/family education;Stair training;Gait  training;Therapeutic exercise;Therapeutic activities;Manual techniques   PT Next Visit Plan DF ROM, progress to plyo-reformer?, balance, gait pattern, all without pain.  Elliptical   PT Home Exercise Plan 4 way SLR.   active DF/Pf / INVER/ EVER band ankle 3 way (no Pf)   gentle PROM DF and toewel exer, yellow band PF; SLS   Consulted and Agree with Plan of Care Patient      Patient will benefit from skilled therapeutic intervention in order to improve the following deficits and impairments:  Decreased range of motion, Difficulty walking, Pain, Increased edema, Decreased strength, Decreased activity tolerance  Visit Diagnosis: Muscle weakness (generalized)  Stiffness of left ankle, not elsewhere classified  Difficulty in walking, not elsewhere classified  Localized edema  Status post Achilles tendon repair  Pain in left ankle and joints of left foot     Problem List Patient Active Problem List   Diagnosis Date Noted  . Achilles rupture, left 02/07/2017    Kailly Richoux PTA 05/12/2017, 3:13 PM  [facility] [address] [place], [place], [address] Phone: [phone]   Fax:  [phone]  Name: Gregory Watts MRN: [id number] Date of Birth: 05-23-1986

## 2017-05-17 ENCOUNTER — Ambulatory Visit: Payer: Self-pay | Admitting: Physical Therapy

## 2017-05-17 DIAGNOSIS — Z9889 Other specified postprocedural states: Secondary | ICD-10-CM

## 2017-05-17 DIAGNOSIS — M6281 Muscle weakness (generalized): Secondary | ICD-10-CM

## 2017-05-17 DIAGNOSIS — M25672 Stiffness of left ankle, not elsewhere classified: Secondary | ICD-10-CM

## 2017-05-17 DIAGNOSIS — R262 Difficulty in walking, not elsewhere classified: Secondary | ICD-10-CM

## 2017-05-17 DIAGNOSIS — M25572 Pain in left ankle and joints of left foot: Secondary | ICD-10-CM

## 2017-05-17 DIAGNOSIS — R600 Localized edema: Secondary | ICD-10-CM

## 2017-05-17 NOTE — Therapy (Signed)
[place], [place], [address] Phone: [phone]   Fax:  [phone]  Physical Therapy Treatment  Patient Details  Name: Gregory Watts MRN: [id number] Date of Birth: Apr 01, 1986 Referring Provider: Ephriam Jenkins, MD  Encounter Date: 05/17/2017      PT End of Session - 05/17/17 1455    Visit Number 18   Number of Visits 29   Date for PT Re-Evaluation 06/17/17   Authorization Type Self pay   PT Start Time 0215   PT Stop Time 0258   PT Time Calculation (min) 43 min      Past Medical History:  Diagnosis Date  . Achilles rupture, left 01/26/2017    Past Surgical History:  Procedure Laterality Date  . ACHILLES TENDON SURGERY Left 02/09/2017   Procedure: LEFT ACHILLES TENDON REPAIR;  Surgeon: Leandrew Koyanagi, MD;  Location: [facility];  Service: Orthopedics;  Laterality: Left;  . NO PAST SURGERIES      There were no vitals filed for this visit.      Subjective Assessment - 05/17/17 1647    Subjective No pain just tightness.    Currently in Pain? No/denies                         [facility] Adult PT Treatment/Exercise - 05/17/17 0001      Knee/Hip Exercises: Stretches   Gastroc Stretch 2 reps;30 seconds   Gastroc Stretch Limitations slant board   Soleus Stretch 1 rep;60 seconds   Soleus Stretch Limitations slant board    Other Knee/Hip Stretches also gastroc and soleus stretch at wall      Knee/Hip Exercises: Aerobic   Elliptical Ramp 10 L3 x 5 minutes      Knee/Hip Exercises: Standing   Lateral Step Up 20 reps;Step Height: 6"   Forward Step Up Limitations forward lunge with fibula mob on step    Step Down 20 reps;Step Height: 4"   Step Down Limitations with retro step up   SLS on and off foam >1 minute    Rebounder 20 tosses with LOB    Other Standing Knee Exercises resisted gait up to 30 lbs backward multiple trials      Ankle Exercises: Standing   Heel Raises 10  reps   Other Standing Ankle Exercises weight shift to left with concentric lowering.                   PT Short Term Goals - 04/28/17 1452      PT SHORT TERM GOAL #1   Title He will be independent with inital HEP   Status Achieved     PT SHORT TERM GOAL #2   Title He will report no increased pain with exercises   Status Achieved     PT SHORT TERM GOAL #3   Title He will have 90 degrees active LT ankle DF   Status Achieved           PT Long Term Goals - 05/10/17 1442      PT LONG TERM GOAL #1   Title He will be independnet with all HEP issued   Baseline independent with exercises issued so far   Status On-going     PT LONG TERM GOAL #2   Title He wil have 2-3 max pain with normal daily activity   Baseline no pain at all   Status Achieved     PT LONG TERM  GOAL #3   Title He will return to work full duty.    Baseline light duty   Status Partially Met     PT LONG TERM GOAL #4   Title He will have active Lt ankle ROM to equal RT to return to normal walking activity   Baseline L 4 R 10     PT LONG TERM GOAL #5   Title He wil begin to jog and cut if allowed by MD    Baseline reports MD has said he is allowed but he does not think he is ready   Status On-going     PT LONG TERM GOAL #6   Title He will be able to walk up and down stairs step over step with one rail   Baseline can do it regularly, forgets coming down   Status Partially Met               Plan - 05/17/17 1514    Clinical Impression Statement Pt reports difficulty with declines and forgets to descend stairs reciprocally. Worked on combined knee and ankle bending exercises with step ups lateral and retro. Cues to control and keep heel down. Pt reports most tightness in anterior lower leg as well as along incision. Static and dynamic balance improved. Still cannot heel raise on single leg. Began Bilateral concentric to left eccentric heel raises and added to HEP.    PT Next Visit Plan DF  ROM, progress to plyo-reformer?, balance, gait pattern, all without pain.  Elliptical   PT Home Exercise Plan 4 way SLR.   active DF/Pf / INVER/ EVER band ankle 3 way (no Pf)   gentle PROM DF and toewel exer, yellow band PF; SLS, bilatera Concentric to eccentric left heel raises    Consulted and Agree with Plan of Care Patient      Patient will benefit from skilled therapeutic intervention in order to improve the following deficits and impairments:  Decreased range of motion, Difficulty walking, Pain, Increased edema, Decreased strength, Decreased activity tolerance  Visit Diagnosis: Muscle weakness (generalized)  Stiffness of left ankle, not elsewhere classified  Difficulty in walking, not elsewhere classified  Status post Achilles tendon repair  Localized edema  Pain in left ankle and joints of left foot     Problem List Patient Active Problem List   Diagnosis Date Noted  . Achilles rupture, left 02/07/2017    Dorene Ar, PTA 05/17/2017, 4:52 PM  [facility] [address] [place], [place], [address] Phone: [phone]   Fax:  [phone]  Name: Gregory Watts MRN: [id number] Date of Birth: July 25, 1986

## 2017-05-24 ENCOUNTER — Ambulatory Visit: Payer: Self-pay | Admitting: Physical Therapy

## 2017-05-24 DIAGNOSIS — M6281 Muscle weakness (generalized): Secondary | ICD-10-CM

## 2017-05-24 DIAGNOSIS — R262 Difficulty in walking, not elsewhere classified: Secondary | ICD-10-CM

## 2017-05-24 DIAGNOSIS — R600 Localized edema: Secondary | ICD-10-CM

## 2017-05-24 DIAGNOSIS — M25572 Pain in left ankle and joints of left foot: Secondary | ICD-10-CM

## 2017-05-24 DIAGNOSIS — M25672 Stiffness of left ankle, not elsewhere classified: Secondary | ICD-10-CM

## 2017-05-24 DIAGNOSIS — Z9889 Other specified postprocedural states: Secondary | ICD-10-CM

## 2017-05-24 NOTE — Therapy (Signed)
[place], [place], [address] Phone: [phone]   Fax:  [phone]  Physical Therapy Treatment  Patient Details  Name: Gregory Watts MRN: [id number] Date of Birth: 1985/12/17 Referring Provider: Ephriam Jenkins, MD  Encounter Date: 05/24/2017      PT End of Session - 05/24/17 1337    Visit Number 19   Number of Visits 29   Date for PT Re-Evaluation 06/17/17   Authorization Type Self pay   PT Start Time 0133   PT Stop Time 0215   PT Time Calculation (min) 42 min      Past Medical History:  Diagnosis Date  . Achilles rupture, left 01/26/2017    Past Surgical History:  Procedure Laterality Date  . ACHILLES TENDON SURGERY Left 02/09/2017   Procedure: LEFT ACHILLES TENDON REPAIR;  Surgeon: Leandrew Koyanagi, MD;  Location: [facility];  Service: Orthopedics;  Laterality: Left;  . NO PAST SURGERIES      There were no vitals filed for this visit.      Subjective Assessment - 05/24/17 1336    Subjective Still no pain just a little tightness    Currently in Pain? No/denies                         [facility] Adult PT Treatment/Exercise - 05/24/17 0001      Knee/Hip Exercises: Aerobic   Stepper 4 min L 4      Knee/Hip Exercises: Machines for Strengthening   Other Machine Reformer:  Toe press 2 red 1 blue bilateral and single x 20 each, prancing x 1 minute, DF stretch with over pressure, Ply board bilateral hopping to single leg hopping      Ankle Exercises: Plyometrics   Bilateral Jumping 20 reps  1 minute    Plyometric Exercises lateral shuffle  30 ft x 4    Plyometric Exercises jogging 61f x 4      Ankle Exercises: Standing   Heel Raises 10 reps   Toe Walk (Round Trip) 1 x parallel bars    Other Standing Ankle Exercises weight shift to left with eccentric lowering.      Ankle Exercises: Stretches   Slant Board Stretch 4 reps;30 seconds  lacks hip extension    Other  Stretch knees bent x 2 knees ext x 2                   PT Short Term Goals - 04/28/17 1452      PT SHORT TERM GOAL #1   Title He will be independent with inital HEP   Status Achieved     PT SHORT TERM GOAL #2   Title He will report no increased pain with exercises   Status Achieved     PT SHORT TERM GOAL #3   Title He will have 90 degrees active LT ankle DF   Status Achieved           PT Long Term Goals - 05/10/17 1442      PT LONG TERM GOAL #1   Title He will be independnet with all HEP issued   Baseline independent with exercises issued so far   Status On-going     PT LONG TERM GOAL #2   Title He wil have 2-3 max pain with normal daily activity   Baseline no pain at all   Status Achieved     PT LONG TERM GOAL #3  Title He will return to work full duty.    Baseline light duty   Status Partially Met     PT LONG TERM GOAL #4   Title He will have active Lt ankle ROM to equal RT to return to normal walking activity   Baseline L 4 R 10     PT LONG TERM GOAL #5   Title He wil begin to jog and cut if allowed by MD    Baseline reports MD has said he is allowed but he does not think he is ready   Status On-going     PT LONG TERM GOAL #6   Title He will be able to walk up and down stairs step over step with one rail   Baseline can do it regularly, forgets coming down   Status Partially Met               Plan - 05/24/17 1443    Clinical Impression Statement Began plyometrics on reformer and in closed chain. Has minimal lift in SLS. Felt good with bilateral hopping however lands with RLE first. Pt to get kump rope and practce bilateral hopping focus on equal landing.    PT Next Visit Plan DF ROM, continue plo-reformer/ gentle progressive plyometrics. , balance, gait pattern, all without pain.  Elliptical/stepper    PT Home Exercise Plan 4 way SLR.   active DF/Pf / INVER/ EVER band ankle 3 way (no Pf)   gentle PROM DF and toewel exer, yellow band PF;  SLS, bilatera Concentric to eccentric left heel raises    Consulted and Agree with Plan of Care Patient      Patient will benefit from skilled therapeutic intervention in order to improve the following deficits and impairments:  Decreased range of motion, Difficulty walking, Pain, Increased edema, Decreased strength, Decreased activity tolerance  Visit Diagnosis: Muscle weakness (generalized)  Stiffness of left ankle, not elsewhere classified  Difficulty in walking, not elsewhere classified  Status post Achilles tendon repair  Localized edema  Pain in left ankle and joints of left foot     Problem List Patient Active Problem List   Diagnosis Date Noted  . Achilles rupture, left 02/07/2017    Dorene Ar, PTA 05/24/2017, 2:46 PM  [facility] [address] [place], [place], [address] Phone: [phone]   Fax:  [phone]  Name: Gregory Watts MRN: [id number] Date of Birth: 1986/01/04

## 2017-05-31 ENCOUNTER — Ambulatory Visit: Payer: Self-pay | Admitting: Physical Therapy

## 2017-05-31 DIAGNOSIS — M6281 Muscle weakness (generalized): Secondary | ICD-10-CM

## 2017-05-31 DIAGNOSIS — M25572 Pain in left ankle and joints of left foot: Secondary | ICD-10-CM

## 2017-05-31 DIAGNOSIS — R600 Localized edema: Secondary | ICD-10-CM

## 2017-05-31 DIAGNOSIS — Z9889 Other specified postprocedural states: Secondary | ICD-10-CM

## 2017-05-31 DIAGNOSIS — R262 Difficulty in walking, not elsewhere classified: Secondary | ICD-10-CM

## 2017-05-31 DIAGNOSIS — M25672 Stiffness of left ankle, not elsewhere classified: Secondary | ICD-10-CM

## 2017-05-31 NOTE — Therapy (Signed)
[place], [place], [address] Phone: [phone]   Fax:  [phone]  Physical Therapy Treatment  Patient Details  Name: Gregory Watts MRN: [id number] Date of Birth: 19-Sep-1985 Referring Provider: Ephriam Jenkins, MD  Encounter Date: 05/31/2017      PT End of Session - 05/31/17 1458    Visit Number 20   Number of Visits 29   Date for PT Re-Evaluation 06/17/17   Authorization Type Self pay   PT Start Time 0213   PT Stop Time 0300   PT Time Calculation (min) 47 min      Past Medical History:  Diagnosis Date  . Achilles rupture, left 01/26/2017    Past Surgical History:  Procedure Laterality Date  . ACHILLES TENDON SURGERY Left 02/09/2017   Procedure: LEFT ACHILLES TENDON REPAIR;  Surgeon: Leandrew Koyanagi, MD;  Location: [facility];  Service: Orthopedics;  Laterality: Left;  . NO PAST SURGERIES      There were no vitals filed for this visit.                       [facility] Adult PT Treatment/Exercise - 05/31/17 0001      Knee/Hip Exercises: Stretches   Gastroc Stretch 1 rep;60 seconds   Gastroc Stretch Limitations slant board    Soleus Stretch 1 rep;60 seconds   Soleus Stretch Limitations slant board      Knee/Hip Exercises: Aerobic   Stepper 5 min L4 on toes       Knee/Hip Exercises: Machines for Teacher, English as a foreign language Reformer:  Toe press 2 red 1 blue bilateral and single x 20 each, prancing x 1 minute, DF stretch with over pressure, Ply board bilateral hopping to single leg hopping      Knee/Hip Exercises: Standing   Lateral Step Up 20 reps;Step Height: 8"   Step Down 20 reps;Step Height: 4"   Step Down Limitations with retro step up   Rebounder on foam pad      Ankle Exercises: Plyometrics   Bilateral Jumping 20 reps  1 minute , squat jump    Plyometric Exercises lateral shuffle  30 ft x 4    Plyometric Exercises jogging 74f x 4    Plyometric Exercises  1 minute small bilateral hopping      Ankle Exercises: Standing   Heel Raises --  single x 10    Toe Walk (Round Trip) 30 ft                   PT Short Term Goals - 04/28/17 1452      PT SHORT TERM GOAL #1   Title He will be independent with inital HEP   Status Achieved     PT SHORT TERM GOAL #2   Title He will report no increased pain with exercises   Status Achieved     PT SHORT TERM GOAL #3   Title He will have 90 degrees active LT ankle DF   Status Achieved           PT Long Term Goals - 05/31/17 1453      PT LONG TERM GOAL #1   Title He will be independnet with all HEP issued   Status On-going     PT LONG TERM GOAL #2   Title He wil have 2-3 max pain with normal daily activity   Status Achieved     PT LONG TERM GOAL #  3   Title He will return to work full duty.    Time 16   Status Achieved     PT LONG TERM GOAL #4   Title He will have active Lt ankle ROM to equal RT to return to normal walking activity   Time 16   Period Weeks   Status Unable to assess     PT LONG TERM GOAL #5   Title He wil begin to jog and cut if allowed by MD    Baseline has begun jogging, hopping   Time 16   Period Weeks   Status On-going     PT LONG TERM GOAL #6   Title He will be able to walk up and down stairs step over step with one rail   Time 16   Period Weeks   Status Unable to assess               Plan - 05/31/17 1434    Clinical Impression Statement Pt able to lift heel x 10 in slngle left heel raise. Continued plometrics, calf strength and stretching with good tolerance. Most restriction felt anterior ankle/shin. He has returned to work full duty. LTG# 3 met.    PT Next Visit Plan DF ROM, continue plo-reformer/ gentle progressive plyometrics. , balance, gait pattern, all without pain.  Elliptical/stepper , check stair descent    PT Home Exercise Plan 4 way SLR.   active DF/Pf / INVER/ EVER band ankle 3 way (no Pf)   gentle PROM DF and toewel exer,  yellow band PF; SLS, bilatera Concentric to eccentric left heel raises    Consulted and Agree with Plan of Care Patient      Patient will benefit from skilled therapeutic intervention in order to improve the following deficits and impairments:  Decreased range of motion, Difficulty walking, Pain, Increased edema, Decreased strength, Decreased activity tolerance  Visit Diagnosis: Muscle weakness (generalized)  Stiffness of left ankle, not elsewhere classified  Difficulty in walking, not elsewhere classified  Status post Achilles tendon repair  Localized edema  Pain in left ankle and joints of left foot     Problem List Patient Active Problem List   Diagnosis Date Noted  . Achilles rupture, left 02/07/2017    Gregory Watts, PTA 05/31/2017, 2:58 PM  [facility] [address] [place], [place], [address] Phone: [phone]   Fax:  [phone]  Name: Gregory Watts MRN: [id number] Date of Birth: 12-18-1985

## 2017-06-07 ENCOUNTER — Encounter: Payer: Self-pay | Admitting: Physical Therapy

## 2017-06-07 ENCOUNTER — Ambulatory Visit: Payer: Self-pay | Attending: Orthopaedic Surgery | Admitting: Physical Therapy

## 2017-06-07 DIAGNOSIS — Z9889 Other specified postprocedural states: Secondary | ICD-10-CM | POA: Insufficient documentation

## 2017-06-07 DIAGNOSIS — M25672 Stiffness of left ankle, not elsewhere classified: Secondary | ICD-10-CM | POA: Insufficient documentation

## 2017-06-07 DIAGNOSIS — M25572 Pain in left ankle and joints of left foot: Secondary | ICD-10-CM | POA: Insufficient documentation

## 2017-06-07 DIAGNOSIS — M6281 Muscle weakness (generalized): Secondary | ICD-10-CM | POA: Insufficient documentation

## 2017-06-07 DIAGNOSIS — R262 Difficulty in walking, not elsewhere classified: Secondary | ICD-10-CM | POA: Insufficient documentation

## 2017-06-07 DIAGNOSIS — R600 Localized edema: Secondary | ICD-10-CM | POA: Insufficient documentation

## 2017-06-07 NOTE — Therapy (Signed)
[place], [place], [address] Phone: [phone]   Fax:  [phone]  Physical Therapy Treatment / Discharge Summary  Patient Details  Name: Gregory Watts MRN: [id number] Date of Birth: April 20, 1986 Referring Provider: Ephriam Jenkins, MD  Encounter Date: 06/07/2017      PT End of Session - 06/07/17 1150    Visit Number 21   Number of Visits 29   Date for PT Re-Evaluation 06/17/17   PT Start Time 1148   PT Stop Time 1220   PT Time Calculation (min) 32 min   Activity Tolerance Patient tolerated treatment well   Behavior During Therapy [facility] for tasks assessed/performed      Past Medical History:  Diagnosis Date  . Achilles rupture, left 01/26/2017    Past Surgical History:  Procedure Laterality Date  . ACHILLES TENDON SURGERY Left 02/09/2017   Procedure: LEFT ACHILLES TENDON REPAIR;  Surgeon: Leandrew Koyanagi, MD;  Location: [facility];  Service: Orthopedics;  Laterality: Left;  . NO PAST SURGERIES      There were no vitals filed for this visit.      Subjective Assessment - 06/07/17 1149    Subjective "no pain I am doing alot better"    Currently in Pain? No/denies   Pain Score 0-No pain   Pain Onset More than a month ago   Pain Frequency Intermittent   Aggravating Factors  N/A   Pain Relieving Factors N/A            [facility] PT Assessment - 06/07/17 1155      Observation/Other Assessments   Focus on Therapeutic Outcomes (FOTO)  23% limited     ROM / Strength   AROM / PROM / Strength Strength     AROM   Left Ankle Dorsiflexion 5  from neutral   Left Ankle Plantar Flexion 60   Left Ankle Inversion 24   Left Ankle Eversion 18     Strength   Strength Assessment Site Ankle   Right/Left Ankle Left   Left Ankle Dorsiflexion 5/5   Left Ankle Plantar Flexion 4+/5   Left Ankle Inversion 4+/5   Left Ankle Eversion 4+/5                     [facility] Adult PT  Treatment/Exercise - 06/07/17 1224      Knee/Hip Exercises: Plyometrics   Bilateral Jumping 3 sets;10 reps  squat jumps   Bilateral Jumping Limitations using mirror for cues to avoid shifting hips/ weight to R with push of and landing   Unilateral Jumping Limitations 2 x 5 forward/ backward, 2 x 5 laterally, 2x 5 jumping diagonals. Lateral bounding 1 x 10   using painted square in the floor.     Knee/Hip Exercises: Standing   Heel Raises Limitations LLE sing leg raise to fatigue, progressed to bil concentric with LLE eccenric                PT Education - 06/07/17 1220    Education provided Yes   Education Details upgraded resistance band, benefits of eccentric tranining and jumping mechanics using mirror for form. gradual progression of running using intervals and increasing time slowly   Person(s) Educated Patient   Methods Explanation;Verbal cues;Handout   Comprehension Verbalized understanding;Verbal cues required          PT Short Term Goals - 06/07/17 1201      PT SHORT TERM GOAL #1   Title  He will be independent with inital HEP   Time 4   Period Weeks   Status Achieved     PT SHORT TERM GOAL #2   Title He will report no increased pain with exercises   Time 4   Period Weeks   Status Achieved     PT SHORT TERM GOAL #3   Title He will have 90 degrees active LT ankle DF   Time 4   Period Weeks   Status Achieved           PT Long Term Goals - 06/07/17 1201      PT LONG TERM GOAL #1   Title He will be independnet with all HEP issued   Time 16   Period Weeks     PT LONG TERM GOAL #2   Title He wil have 2-3 max pain with normal daily activity   Time 16   Period Weeks   Status Achieved     PT LONG TERM GOAL #3   Title He will return to work full duty.    Time 16   Period Weeks   Status Achieved     PT LONG TERM GOAL #4   Title He will have active Lt ankle ROM to equal RT to return to normal walking activity   Time 16   Period Weeks    Status Partially Met     PT LONG TERM GOAL #5   Title He wil begin to jog and cut if allowed by MD    Time 16   Period Weeks   Status Achieved     PT LONG TERM GOAL #6   Title He will be able to walk up and down stairs step over step with one rail   Time 16   Period Weeks   Status Achieved               Plan - 06/07/17 1221    Clinical Impression Statement pt reports no pain and has made great progress with physical therapy increasing ankle ROM and strength. He reprots he has returned to full duty at work and noted it has helped. He required verbal/ visual cues with jumping and landing activities which he is able to correct with cues. He has met or partially met all goals and reports he is able to maintain and progress is his current level of function independently.   Rehab Potential Good   PT Treatment/Interventions Cryotherapy;Passive range of motion;Patient/family education;Stair training;Gait training;Therapeutic exercise;Therapeutic activities;Manual techniques   PT Next Visit Plan D/C   PT Home Exercise Plan 4 way SLR.   active DF/Pf / INVER/ EVER band ankle 3 way (no Pf)   gentle PROM DF and toewel exer, yellow band PF; SLS, bilatera Concentric to eccentric left heel raises    Consulted and Agree with Plan of Care Patient      Patient will benefit from skilled therapeutic intervention in order to improve the following deficits and impairments:  Decreased range of motion, Difficulty walking, Pain, Increased edema, Decreased strength, Decreased activity tolerance  Visit Diagnosis: Muscle weakness (generalized)  Stiffness of left ankle, not elsewhere classified  Difficulty in walking, not elsewhere classified  Status post Achilles tendon repair  Localized edema  Pain in left ankle and joints of left foot     Problem List Patient Active Problem List   Diagnosis Date Noted  . Achilles rupture, left 02/07/2017   Starr Lake PT, DPT, LAT, ATC  06/07/17   12:29  PM      [place], [place], [address] Phone: [phone]   Fax:  [phone]  Name: Jahan Friedlander MRN: [id number] Date of Birth: 02/28/1986    PHYSICAL THERAPY DISCHARGE SUMMARY  Visits from Start of Care: 21  Current functional level related to goals / functional outcomes: See goals, FOTO 23% limited   Remaining deficits: Compensation with hip lateral shift to the R with jumping/landing (corrected with cues).    Education / Equipment: HEP, theraband,   Plan: Patient agrees to discharge.  Patient goals were partially met. Patient is being discharged due to being pleased with the current functional level.  ?????     Kristoffer Leamon PT, DPT, LAT, ATC  06/07/17  12:31 PM

## 2017-06-14 ENCOUNTER — Encounter: Payer: Self-pay | Admitting: Physical Therapy

## 2019-02-28 IMAGING — CR DG FOOT COMPLETE 3+V*L*
3 series · 3 of 3 positions shown · non-contrast
Comparison: None.

CLINICAL DATA: Pt injured lt foot and ankle yesterday while playing
basketball when another player came down on it, pain lt heel lateral
lt ankle.

EXAM:
LEFT FOOT - COMPLETE 3+ VIEW

[x foot ap left]
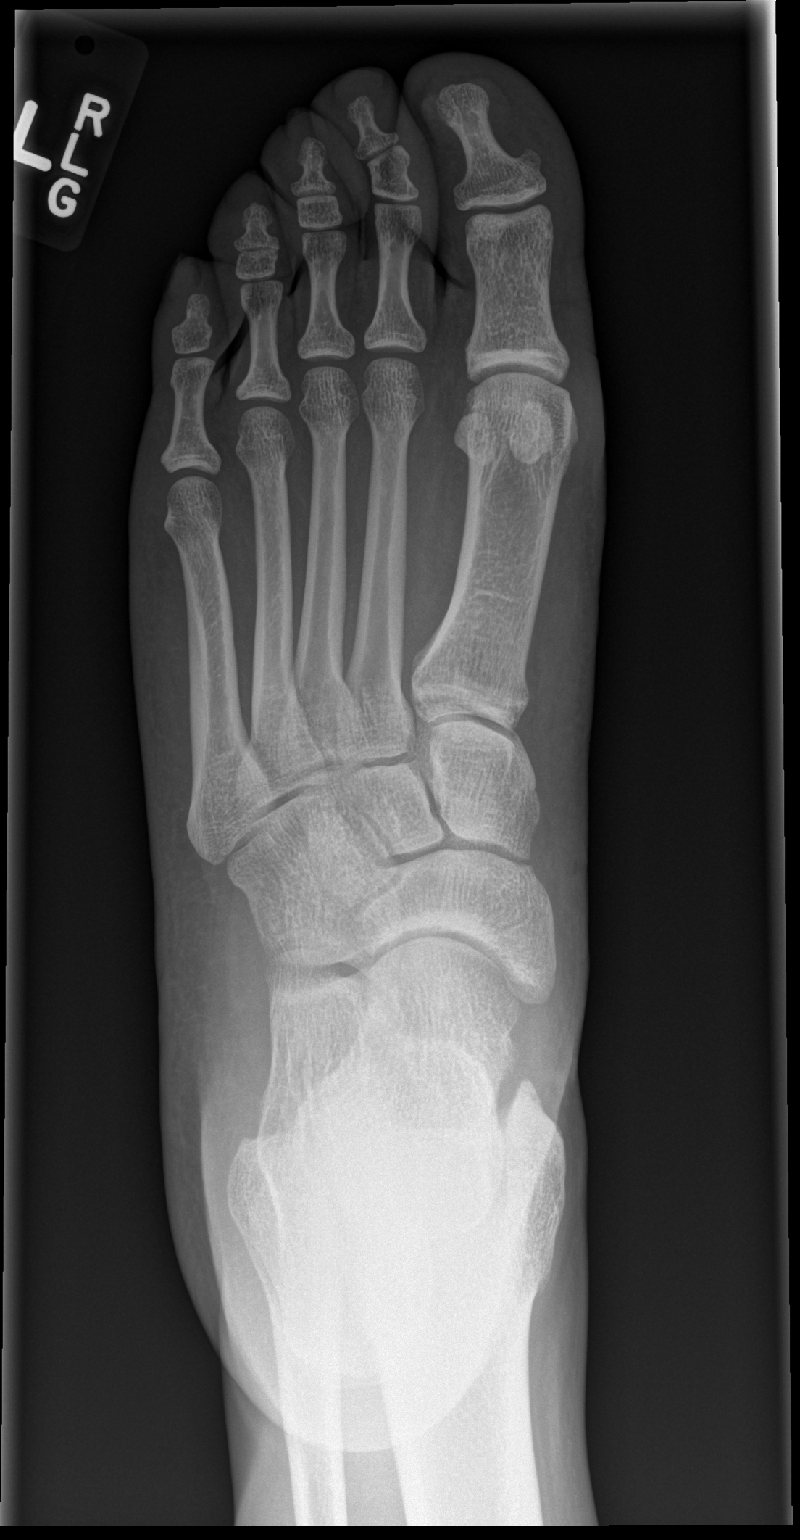

[x foot obl left]
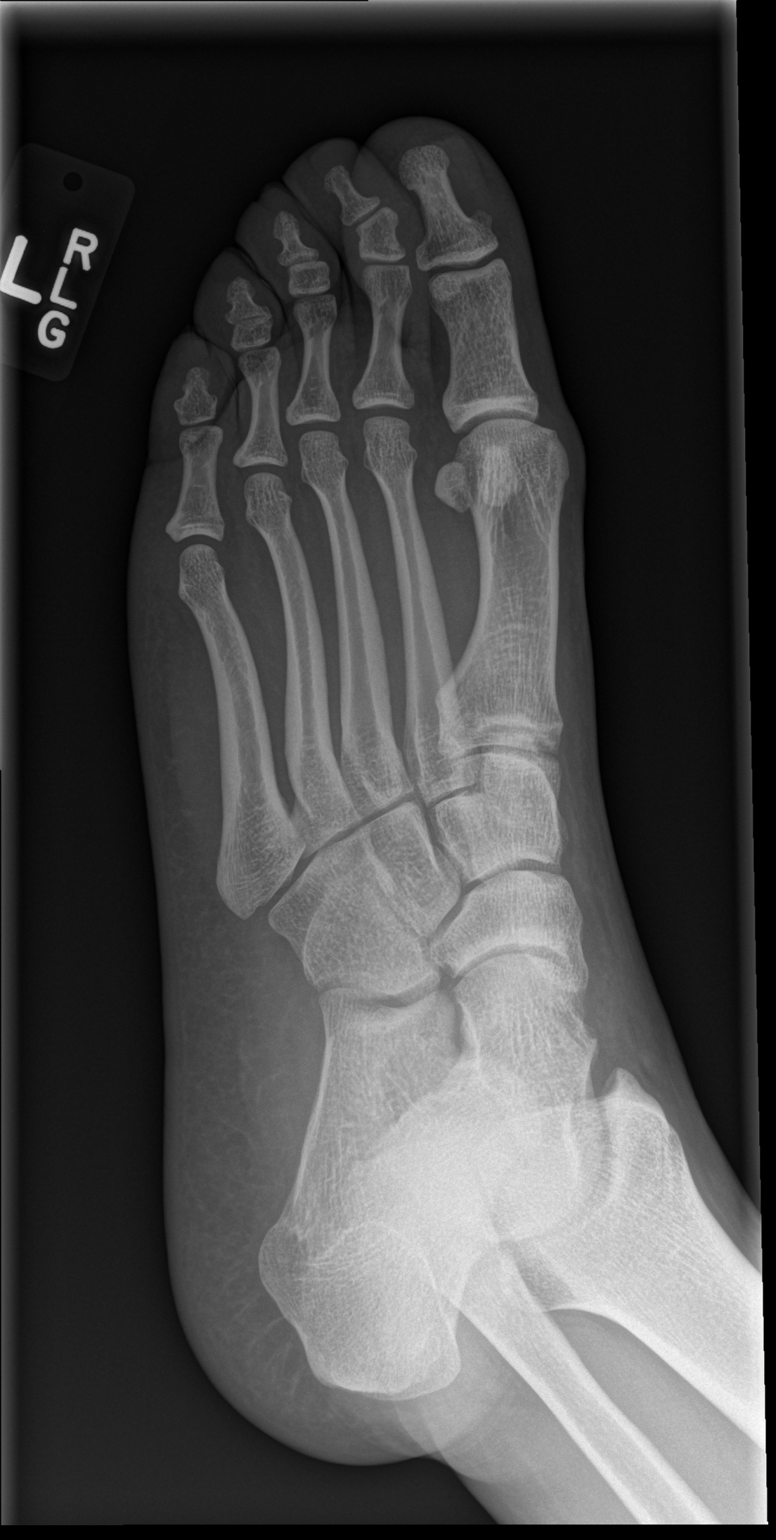

[x foot lat left]
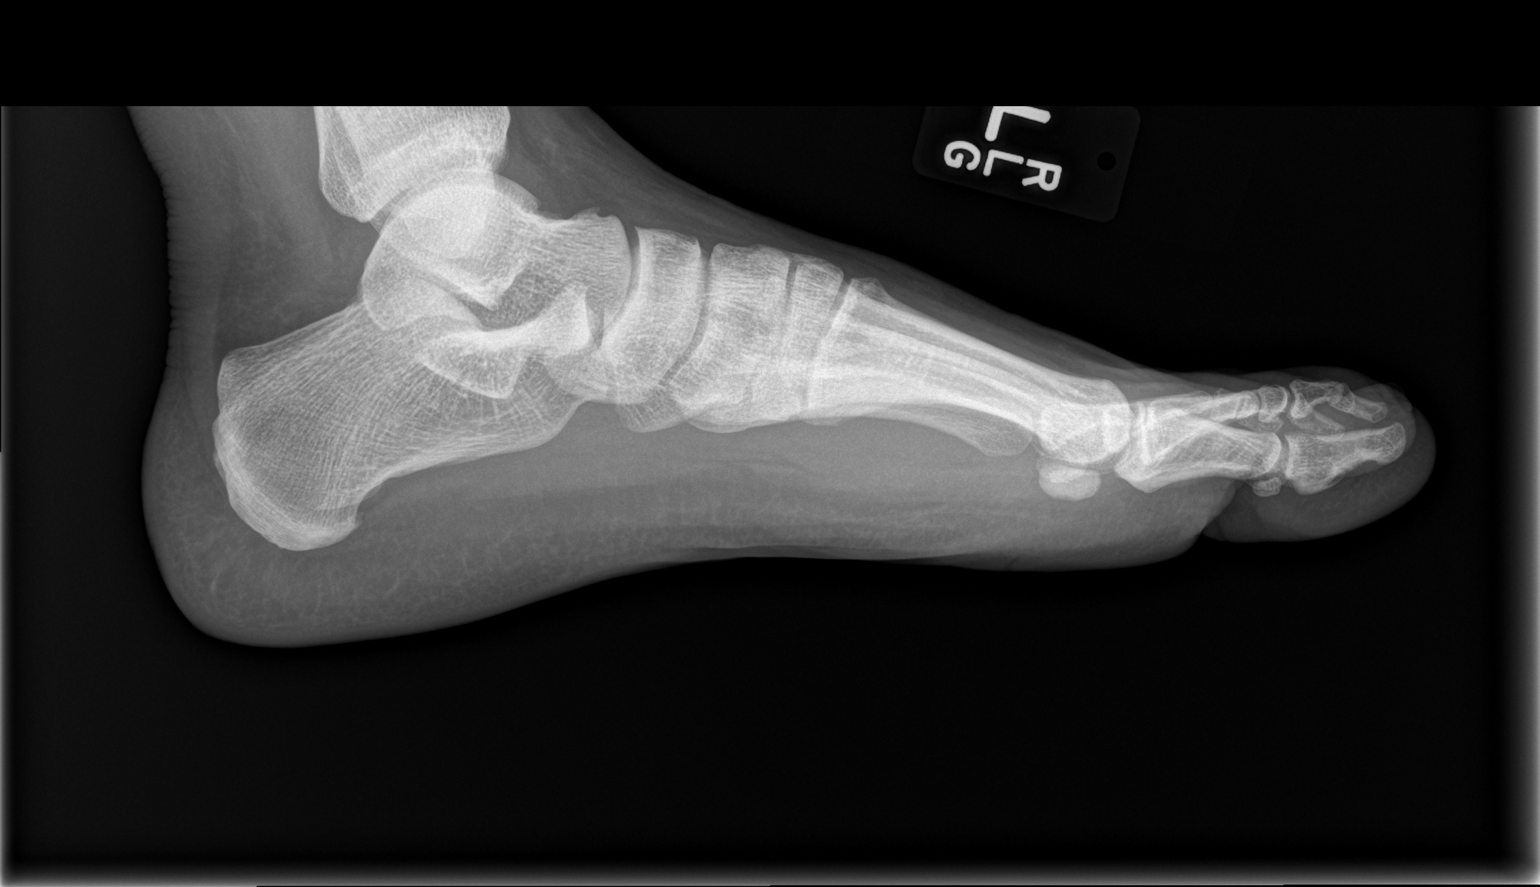

[3 of 3 positions shown; findings below may reference images not displayed]

FINDINGS: There is no evidence of fracture or dislocation. There is no
evidence of arthropathy or other focal bone abnormality. Soft
tissues are unremarkable.
IMPRESSION: No acute osseous injury of the left foot.

## 2019-11-30 ENCOUNTER — Ambulatory Visit: Payer: Self-pay
